# Patient Record
Sex: Male | Born: 1990 | Race: White | Hispanic: No | Marital: Single | Smoking: Current every day smoker
Health system: Southern US, Community
[De-identification: ages and names within clinical notes are randomized; demographics above are authoritative.]

## PROBLEM LIST (undated history)

## (undated) DIAGNOSIS — K219 Gastro-esophageal reflux disease without esophagitis: Secondary | ICD-10-CM

## (undated) DIAGNOSIS — R519 Headache, unspecified: Secondary | ICD-10-CM

## (undated) HISTORY — PX: HERNIA REPAIR: SHX51

## (undated) HISTORY — PX: NO PAST SURGERIES: SHX2092

---

## 2011-04-16 ENCOUNTER — Emergency Department: Payer: Self-pay | Admitting: Emergency Medicine

## 2011-10-19 ENCOUNTER — Emergency Department: Payer: Self-pay | Admitting: Emergency Medicine

## 2011-10-24 ENCOUNTER — Emergency Department: Payer: Self-pay | Admitting: *Deleted

## 2015-08-08 ENCOUNTER — Encounter: Payer: Self-pay | Admitting: Emergency Medicine

## 2015-08-08 ENCOUNTER — Emergency Department: Payer: Self-pay

## 2015-08-08 ENCOUNTER — Other Ambulatory Visit: Payer: Self-pay

## 2015-08-08 ENCOUNTER — Emergency Department
Admission: EM | Admit: 2015-08-08 | Discharge: 2015-08-08 | Disposition: A | Discharge status: Home / Self Care | Payer: Self-pay | Attending: Emergency Medicine | Admitting: Emergency Medicine

## 2015-08-08 DIAGNOSIS — IMO0001 Reserved for inherently not codable concepts without codable children: Secondary | ICD-10-CM

## 2015-08-08 DIAGNOSIS — R030 Elevated blood-pressure reading, without diagnosis of hypertension: Secondary | ICD-10-CM | POA: Insufficient documentation

## 2015-08-08 DIAGNOSIS — Z720 Tobacco use: Secondary | ICD-10-CM | POA: Insufficient documentation

## 2015-08-08 LAB — CBC WITH DIFFERENTIAL/PLATELET
BASOS ABS: 0 10*3/uL (ref 0–0.1)
Basophils Relative: 1 %
EOS ABS: 0.3 10*3/uL (ref 0–0.7)
Eosinophils Relative: 3 %
HCT: 40.5 % (ref 40.0–52.0)
HEMOGLOBIN: 13.5 g/dL (ref 13.0–18.0)
LYMPHS ABS: 3.2 10*3/uL (ref 1.0–3.6)
LYMPHS PCT: 32 %
MCH: 29.9 pg (ref 26.0–34.0)
MCHC: 33.4 g/dL (ref 32.0–36.0)
MCV: 89.6 fL (ref 80.0–100.0)
Monocytes Absolute: 0.5 10*3/uL (ref 0.2–1.0)
Monocytes Relative: 5 %
NEUTROS PCT: 59 %
Neutro Abs: 5.9 10*3/uL (ref 1.4–6.5)
Platelets: 174 10*3/uL (ref 150–440)
RBC: 4.53 MIL/uL (ref 4.40–5.90)
RDW: 13.6 % (ref 11.5–14.5)
WBC: 9.9 10*3/uL (ref 3.8–10.6)

## 2015-08-08 LAB — COMPREHENSIVE METABOLIC PANEL
ALT: 21 U/L (ref 17–63)
AST: 27 U/L (ref 15–41)
Albumin: 4.1 g/dL (ref 3.5–5.0)
Alkaline Phosphatase: 56 U/L (ref 38–126)
Anion gap: 6 (ref 5–15)
BUN: 7 mg/dL (ref 6–20)
CHLORIDE: 105 mmol/L (ref 101–111)
CO2: 27 mmol/L (ref 22–32)
Calcium: 8.8 mg/dL — ABNORMAL LOW (ref 8.9–10.3)
Creatinine, Ser: 0.81 mg/dL (ref 0.61–1.24)
Glucose, Bld: 84 mg/dL (ref 65–99)
POTASSIUM: 4.1 mmol/L (ref 3.5–5.1)
Sodium: 138 mmol/L (ref 135–145)
TOTAL PROTEIN: 6.3 g/dL — AB (ref 6.5–8.1)
Total Bilirubin: 0.2 mg/dL — ABNORMAL LOW (ref 0.3–1.2)

## 2015-08-08 LAB — URINALYSIS COMPLETE WITH MICROSCOPIC (ARMC ONLY)
BILIRUBIN URINE: NEGATIVE
Bacteria, UA: NONE SEEN
Glucose, UA: NEGATIVE mg/dL
HGB URINE DIPSTICK: NEGATIVE
KETONES UR: NEGATIVE mg/dL
LEUKOCYTES UA: NEGATIVE
Nitrite: NEGATIVE
PH: 6 (ref 5.0–8.0)
Protein, ur: NEGATIVE mg/dL
RBC / HPF: NONE SEEN RBC/hpf (ref 0–5)
SPECIFIC GRAVITY, URINE: 1.013 (ref 1.005–1.030)
Squamous Epithelial / LPF: NONE SEEN

## 2015-08-08 LAB — TROPONIN I

## 2015-08-08 NOTE — ED Provider Notes (Signed)
Louis Blackwell, attending physician, personally viewed and interpreted this EKG  EKG Time: 1120 Rate: 71 Rhythm: NSR Axis: normal Intervals: qtc 389 QRS: narrow ST changes: no st elevation    Phineas Semen, MD 08/08/15 1259

## 2015-08-08 NOTE — ED Notes (Signed)
Pt reports going to the plasma clinic today and they "pissed me off" and my pulse went up.  Skin w/d

## 2015-08-08 NOTE — ED Notes (Signed)
States his pulse rate was up at the plasma donation center.  Now is pulse rate is normal but his blood pressure is elevated  Denies any pain  NAD noted

## 2015-08-08 NOTE — Discharge Instructions (Signed)
Hypertension Hypertension is another name for high blood pressure. High blood pressure forces your heart to work harder to pump blood. A blood pressure reading has two numbers, which includes a higher number over a lower number (example: 110/72). HOME CARE   Have your blood pressure rechecked by your doctor.  Only take medicine as told by your doctor. Follow the directions carefully. The medicine does not work as well if you skip doses. Skipping doses also puts you at risk for problems.  Do not smoke.  Monitor your blood pressure at home as told by your doctor. GET HELP IF:  You think you are having a reaction to the medicine you are taking.  You have repeat headaches or feel dizzy.  You have puffiness (swelling) in your ankles.  You have trouble with your vision. GET HELP RIGHT AWAY IF:   You get a very bad headache and are confused.  You feel weak, numb, or faint.  You get chest or belly (abdominal) pain.  You throw up (vomit).  You cannot breathe very well. MAKE SURE YOU:   Understand these instructions.  Will watch your condition.  Will get help right away if you are not doing well or get worse. Document Released: 05/20/2008 Document Revised: 12/07/2013 Document Reviewed: 09/24/2013 [facility] Patient Information 2015 [place], [place]. This information is not intended to replace advice given to you by your health care provider. Make sure you discuss any questions you have with your health care provider.    FOLLOW UP WITH ONE OF THE CLINICS LISTED ON YOUR PAPERS TODAY IF ANY CONTINUED PROBLEMS

## 2015-08-08 NOTE — ED Provider Notes (Signed)
[facility] Emergency Department Provider Note  ____________________________________________  Time seen: Approximately 10:33 AM  I have reviewed the triage vital signs and the nursing notes.   HISTORY  Chief Complaint Anxiety   HPI Louis Blackwell is a 24 y.o. male is here today for evaluation of his pulse rate. He states that 3 times last week including this week he was at the plasma donation center and his pulse rate was in the 100s.   he was told that he could not donate any plasma until he was medically cleared.Patient states it is always this way. He states there is a family history of cardiac disease. He is unaware of any heart problems or hypertension to his knowledge. He does not have a PCP. He continues to smoke.   History reviewed. No pertinent past medical history.  There are no active problems to display for this patient.   History reviewed. No pertinent past surgical history.  No current outpatient prescriptions on file.  Allergies Review of patient's allergies indicates no known allergies.  History reviewed. No pertinent family history.  Social History Social History  Substance Use Topics  . Smoking status: Current Every Day Smoker  . Smokeless tobacco: None  . Alcohol Use: Yes    Review of Systems Constitutional: No fever/chills Eyes: No visual changes. ENT: No sore throat. Cardiovascular: Denies chest pain. Respiratory: Denies shortness of breath. Gastrointestinal: No abdominal pain.  No nausea, no vomiting.  No diarrhea.  No constipation. Genitourinary: Negative for dysuria. Musculoskeletal: Negative for back pain. Skin: Negative for rash. Neurological: Negative for headaches, focal weakness or numbness.  10-point ROS otherwise negative.  ____________________________________________   PHYSICAL EXAM:  VITAL SIGNS: ED Triage Vitals  Enc Vitals Group     BP 08/08/15 0952 162/98 mmHg     Pulse --      Resp 08/08/15  0952 18     Temp 08/08/15 0952 98.1 F (36.7 C)     Temp Source 08/08/15 0952 Oral     SpO2 08/08/15 0952 100 %     Weight 08/08/15 0952 170 lb (77.111 kg)     Height 08/08/15 0952 5\' 8"  (1.727 m)     Head Cir --      Peak Flow --      Pain Score --      Pain Loc --      Pain Edu? --      Excl. in GC? --     Constitutional: Alert and oriented. Well appearing and in no acute distress. Eyes: Conjunctivae are normal. PERRL. EOMI. Head: Atraumatic. Nose: No congestion/rhinnorhea. Neck: No stridor.   Hematological/Lymphatic/Immunilogical: No cervical lymphadenopathy. Cardiovascular: Normal rate, regular rhythm. Grossly normal heart sounds.  Good peripheral circulation. Respiratory: Normal respiratory effort.  No retractions. Lungs CTAB. Gastrointestinal: Soft and nontender. No distention. Musculoskeletal: No lower extremity tenderness nor edema.  No joint effusions. Neurologic:  Normal speech and language. No gross focal neurologic deficits are appreciated. No gait instability. Skin:  Skin is warm, dry and intact. No rash noted. Psychiatric: Mood and affect are normal. Speech and behavior are normal.  ____________________________________________   LABS (all labs ordered are listed, but only abnormal results are displayed)  Labs Reviewed  COMPREHENSIVE METABOLIC PANEL - Abnormal; Notable for the following:    Calcium 8.8 (*)    Total Protein 6.3 (*)    Total Bilirubin 0.2 (*)    All other components within normal limits  URINALYSIS COMPLETEWITH MICROSCOPIC ([facility] ONLY) - Abnormal; Notable  for the following:    Color, Urine STRAW (*)    APPearance CLEAR (*)    All other components within normal limits  TROPONIN I  CBC WITH DIFFERENTIAL/PLATELET   ____________________________________________  EKG  EKG per Dr. Derrill Kay ____________________________________________  RADIOLOGY  Chest x-ray per radiologist  negative ____________________________________________   PROCEDURES  Procedure(s) performed: None  Critical Care performed: No  ____________________________________________   INITIAL IMPRESSION / ASSESSMENT AND PLAN / ED COURSE  Pertinent labs & imaging results that were available during my care of the patient were reviewed by me and considered in my medical decision making (see chart for details).  Patient was given a note to take to the plasma clinic stating that today there was no findings that are preventing him from giving plasma. He is to follow-up on his elevated blood pressure with one of the free clinics that was listed on his paper. ____________________________________________   FINAL CLINICAL IMPRESSION(S) / ED DIAGNOSES  Final diagnoses:  Blood pressure elevated      Tommi Rumps, PA-C 08/08/15 1516  Emily Filbert, MD 08/08/15 1524

## 2017-01-28 ENCOUNTER — Encounter: Payer: Self-pay | Admitting: Emergency Medicine

## 2017-01-28 DIAGNOSIS — K0889 Other specified disorders of teeth and supporting structures: Secondary | ICD-10-CM | POA: Insufficient documentation

## 2017-01-28 DIAGNOSIS — F17200 Nicotine dependence, unspecified, uncomplicated: Secondary | ICD-10-CM | POA: Insufficient documentation

## 2017-01-28 DIAGNOSIS — Z5321 Procedure and treatment not carried out due to patient leaving prior to being seen by health care provider: Secondary | ICD-10-CM | POA: Insufficient documentation

## 2017-01-28 NOTE — ED Triage Notes (Signed)
Patient ambulatory to triage with steady gait, without difficulty or distress noted; pt reports left upper dental pain since this am

## 2017-01-29 ENCOUNTER — Emergency Department
Admission: EM | Admit: 2017-01-29 | Discharge: 2017-01-29 | Disposition: A | Discharge status: Home / Self Care | Payer: Self-pay | Attending: Emergency Medicine | Admitting: Emergency Medicine

## 2017-03-24 ENCOUNTER — Encounter: Payer: Self-pay | Admitting: Emergency Medicine

## 2017-03-24 ENCOUNTER — Emergency Department
Admission: EM | Admit: 2017-03-24 | Discharge: 2017-03-24 | Disposition: A | Discharge status: Home / Self Care | Payer: Self-pay | Attending: Emergency Medicine | Admitting: Emergency Medicine

## 2017-03-24 DIAGNOSIS — Y929 Unspecified place or not applicable: Secondary | ICD-10-CM | POA: Insufficient documentation

## 2017-03-24 DIAGNOSIS — F17210 Nicotine dependence, cigarettes, uncomplicated: Secondary | ICD-10-CM | POA: Insufficient documentation

## 2017-03-24 DIAGNOSIS — Y939 Activity, unspecified: Secondary | ICD-10-CM | POA: Insufficient documentation

## 2017-03-24 DIAGNOSIS — Y999 Unspecified external cause status: Secondary | ICD-10-CM | POA: Insufficient documentation

## 2017-03-24 DIAGNOSIS — T161XXA Foreign body in right ear, initial encounter: Secondary | ICD-10-CM | POA: Insufficient documentation

## 2017-03-24 DIAGNOSIS — X58XXXA Exposure to other specified factors, initial encounter: Secondary | ICD-10-CM | POA: Insufficient documentation

## 2017-03-24 MED ORDER — OXYCODONE-ACETAMINOPHEN 5-325 MG PO TABS
1.0000 | ORAL_TABLET | Freq: Once | ORAL | Status: AC
  Administered 2017-03-24: 1 via ORAL
  Filled 2017-03-24: qty 1

## 2017-03-24 MED ORDER — LIDOCAINE HCL (PF) 1 % IJ SOLN
INTRAMUSCULAR | Status: AC
  Filled 2017-03-24: qty 5

## 2017-03-24 MED ORDER — IBUPROFEN 600 MG PO TABS
600.0000 mg | ORAL_TABLET | Freq: Once | ORAL | Status: AC
  Administered 2017-03-24: 600 mg via ORAL
  Filled 2017-03-24: qty 1

## 2017-03-24 NOTE — ED Notes (Signed)
See triage note   States he woke up with pain to right ear feels like something is in his ear

## 2017-03-24 NOTE — ED Provider Notes (Signed)
[facility] Emergency Department Provider Note   ____________________________________________   First MD Initiated Contact with Patient 03/24/17 [phone]     (approximate)  I have reviewed the triage vital signs and the nursing notes.   HISTORY  Chief Complaint Otalgia    HPI Louis Blackwell is a 26 y.o. male patient complain of pain with suspected foreign body in the right ear. Patient states sensation point a.m. awakening. No palliative measures for his complaint. Patient rates his pain as a 10 over 10. Patient denies hearing loss.   History reviewed. No pertinent past medical history.  There are no active problems to display for this patient.   History reviewed. No pertinent surgical history.  Prior to Admission medications   Not on File    Allergies Patient has no known allergies.  History reviewed. No pertinent family history.  Social History Social History  Substance Use Topics  . Smoking status: Current Every Day Smoker    Packs/day: 0.50    Types: Cigarettes  . Smokeless tobacco: Never Used  . Alcohol use Yes    Review of Systems Constitutional: No fever/chills Eyes: No visual changes. ENT: No sore throat.Right ear pain Cardiovascular: Denies chest pain. Respiratory: Denies shortness of breath. Gastrointestinal: No abdominal pain.  No nausea, no vomiting.  No diarrhea.  No constipation. Genitourinary: Negative for dysuria. Musculoskeletal: Negative for back pain. Skin: Negative for rash. Neurological: Negative for headaches, focal weakness or numbness.    ____________________________________________   PHYSICAL EXAM:  VITAL SIGNS: ED Triage Vitals  Enc Vitals Group     BP 03/24/17 0748 (!) 148/90     Pulse Rate 03/24/17 0748 86     Resp 03/24/17 0748 16     Temp 03/24/17 0748 98.3 F (36.8 C)     Temp Source 03/24/17 0748 Oral     SpO2 03/24/17 0748 95 %     Weight 03/24/17 0749 202 lb (91.6 kg)     Height  03/24/17 0749  (1.727 m)     Head Circumference --      Peak Flow --      Pain Score 03/24/17 0747 10     Pain Loc --      Pain Edu? --      Excl. in GC? --     Constitutional: Alert and oriented. Well appearing and in no acute distress. Eyes: Conjunctivae are normal. PERRL. EOMI. Head: Atraumatic. Nose: No congestion/rhinnorhea. EARS: Insect in right ear canal Mouth/Throat: Mucous membranes are moist.  Oropharynx non-erythematous. Neck: No stridor.  No cervical spine tenderness to palpation. Hematological/Lymphatic/Immunilogical: No cervical lymphadenopathy. ardiovascular: Normal rate, regular rhythm. Grossly normal heart sounds.  Good peripheral circulation. Respiratory: Normal respiratory effort.  No retractions. Lungs CTAB. Gastrointestinal: Soft and nontender. No distention. No abdominal bruits. No CVA tenderness. Musculoskeletal: No lower extremity tenderness nor edema.  No joint effusions. Neurologic:  Normal speech and language. No gross focal neurologic deficits are appreciated. No gait instability. Skin:  Skin is warm, dry and intact. No rash noted. Psychiatric: Mood and affect are normal. Speech and behavior are normal.  ____________________________________________   LABS (all labs ordered are listed, but only abnormal results are displayed)  Labs Reviewed - No data to display ____________________________________________  EKG   ____________________________________________  RADIOLOGY   ____________________________________________   PROCEDURES  Procedure(s) performed: None  Procedures  Critical Care performed: No  ____________________________________________   INITIAL IMPRESSION / ASSESSMENT AND PLAN / ED COURSE  Pertinent labs & imaging results  that were available during my care of the patient were reviewed by me and considered in my medical decision making (see chart for details).  Foreign body consistent insect in right ear. Lidocaine was  placed in the ear. Insect was removed intact from right ear canal with alligator forceps.      ____________________________________________   FINAL CLINICAL IMPRESSION(S) / ED DIAGNOSES  Final diagnoses:  Foreign body of right ear, initial encounter  Patient given discharge care instructions. Patient [facility].    NEW MEDICATIONS STARTED DURING THIS VISIT:  New Prescriptions   No medications on file     Note:  This document was prepared using Dragon voice recognition software and may include unintentional dictation errors.    Joni Reining, PA-C 03/24/17 1610    Jene Every, MD 03/25/17 [phone]

## 2017-03-24 NOTE — ED Triage Notes (Signed)
Awoke this AM with right ear pain. States it feels like there is something in his ear.  Denies recent URI.

## 2017-11-12 ENCOUNTER — Encounter: Payer: Self-pay | Admitting: *Deleted

## 2017-11-12 ENCOUNTER — Other Ambulatory Visit: Payer: Self-pay

## 2017-11-12 DIAGNOSIS — F17210 Nicotine dependence, cigarettes, uncomplicated: Secondary | ICD-10-CM | POA: Diagnosis not present

## 2017-11-12 DIAGNOSIS — R42 Dizziness and giddiness: Secondary | ICD-10-CM | POA: Diagnosis not present

## 2017-11-12 DIAGNOSIS — R1013 Epigastric pain: Secondary | ICD-10-CM | POA: Diagnosis not present

## 2017-11-12 DIAGNOSIS — R109 Unspecified abdominal pain: Secondary | ICD-10-CM | POA: Diagnosis present

## 2017-11-12 LAB — URINALYSIS, COMPLETE (UACMP) WITH MICROSCOPIC
Bacteria, UA: NONE SEEN
Bilirubin Urine: NEGATIVE
Glucose, UA: NEGATIVE mg/dL
HGB URINE DIPSTICK: NEGATIVE
Ketones, ur: NEGATIVE mg/dL
Leukocytes, UA: NEGATIVE
Nitrite: NEGATIVE
PROTEIN: NEGATIVE mg/dL
SPECIFIC GRAVITY, URINE: 1.019 (ref 1.005–1.030)
pH: 5 (ref 5.0–8.0)

## 2017-11-12 LAB — COMPREHENSIVE METABOLIC PANEL
ALBUMIN: 4.3 g/dL (ref 3.5–5.0)
ALK PHOS: 71 U/L (ref 38–126)
ALT: 33 U/L (ref 17–63)
AST: 33 U/L (ref 15–41)
Anion gap: 9 (ref 5–15)
BILIRUBIN TOTAL: 0.7 mg/dL (ref 0.3–1.2)
BUN: 11 mg/dL (ref 6–20)
CALCIUM: 9.3 mg/dL (ref 8.9–10.3)
CHLORIDE: 102 mmol/L (ref 101–111)
CO2: 26 mmol/L (ref 22–32)
CREATININE: 0.73 mg/dL (ref 0.61–1.24)
GFR calc non Af Amer: >60 mL/min (ref >60)
GLUCOSE: 96 mg/dL (ref 65–99)
Potassium: 3.6 mmol/L (ref 3.5–5.1)
SODIUM: 137 mmol/L (ref 135–145)
Total Protein: 6.9 g/dL (ref 6.5–8.1)

## 2017-11-12 LAB — CBC
HCT: 43.2 % (ref 40.0–52.0)
HEMOGLOBIN: 14.8 g/dL (ref 13.0–18.0)
MCH: 30.4 pg (ref 26.0–34.0)
MCHC: 34.4 g/dL (ref 32.0–36.0)
MCV: 88.4 fL (ref 80.0–100.0)
Platelets: 175 10*3/uL (ref 150–440)
RBC: 4.88 MIL/uL (ref 4.40–5.90)
RDW: 13.4 % (ref 11.5–14.5)
WBC: 9.2 10*3/uL (ref 3.8–10.6)

## 2017-11-12 LAB — LIPASE, BLOOD: Lipase: 33 U/L (ref 11–51)

## 2017-11-12 NOTE — ED Triage Notes (Signed)
Pt ambulatory to triage.  Pt reports dizziness since last night.  Sx began at work.  Pt states he also has abd pain for 3 days.  No n/v/d.  Pt alert.  Speech clear.

## 2017-11-13 ENCOUNTER — Emergency Department
Admission: EM | Admit: 2017-11-13 | Discharge: 2017-11-13 | Disposition: A | Discharge status: Home / Self Care | Payer: PRIVATE HEALTH INSURANCE | Attending: Emergency Medicine | Admitting: Emergency Medicine

## 2017-11-13 DIAGNOSIS — R42 Dizziness and giddiness: Secondary | ICD-10-CM

## 2017-11-13 DIAGNOSIS — R1013 Epigastric pain: Secondary | ICD-10-CM

## 2017-11-13 MED ORDER — SUCRALFATE 1 G PO TABS: 1.0000 g | ORAL_TABLET | Freq: Four times a day (QID) | ORAL | 1 refills | Status: DC | PRN

## 2017-11-13 MED ORDER — ONDANSETRON HCL 4 MG PO TABS: ORAL_TABLET | ORAL | 0 refills | Status: DC

## 2017-11-13 MED ORDER — ONDANSETRON 4 MG PO TBDP
4.0000 mg | ORAL_TABLET | Freq: Once | ORAL | Status: AC
  Administered 2017-11-13: 4 mg via ORAL
  Filled 2017-11-13: qty 1

## 2017-11-13 NOTE — Discharge Instructions (Signed)
Your workup in the Emergency Department today was reassuring.  We did not find any specific abnormalities.  We recommend you drink plenty of fluids, take your regular medications and/or any new ones prescribed today, and follow up with the doctor(s) listed in these documents as recommended.  Return to the Emergency Department if you develop new or worsening symptoms that concern you.  

## 2017-11-13 NOTE — ED Provider Notes (Signed)
[facility] Emergency Department Provider Note  ____________________________________________   First MD Initiated Contact with Patient 11/13/17 0126     (approximate)  I have reviewed the triage vital signs and the nursing notes.   HISTORY  Chief Complaint Dizziness and Abdominal Pain    HPI Louis Blackwell is a 26 y.o. male with no reported past medical history who presents for several days of gradual onset but persistent complaints including lightheadedness, epigastic pain, nausea, and general malaise.  Nothing particular makes his symptoms better or worse but he states that the symptoms are severe.  He denies fever/chills, chest pain, shortness of breath, vomiting, diarrhea, and dysuria.  He has had no numbness or tingling.  He describes the "dizziness" as lightheadedness rather than the sensation of anything spinning.  He has had no difficulty with ambulation.  He says he just feels tired and "not good".  He does report that he drinks alcohol regularly although "less than I used to" but he did have some alcohol the night before his abdominal pain and other symptoms again.  He denies drug use "for at least 30 days".  He is a regular smoker.  No past medical history on file.  There are no active problems to display for this patient.   No past surgical history on file.  Prior to Admission medications   Medication Sig Start Date End Date Taking? Authorizing Provider  ondansetron (ZOFRAN) 4 MG tablet Take 1-2 tabs by mouth every 8 hours as needed for nausea/vomiting 11/13/17   Loleta RoseForbach, Chelsey Kimberley, MD  sucralfate (CARAFATE) 1 g tablet Take 1 tablet (1 g total) by mouth 4 (four) times daily as needed (for abdominal discomfort, nausea, and/or vomiting). 11/13/17   Loleta RoseForbach, Verlon Pischke, MD    Allergies Patient has no known allergies.  No family history on file.  Social History Social History   Tobacco Use  . Smoking status: Current Every Day Smoker    Packs/day: 0.50     Types: Cigarettes  . Smokeless tobacco: Never Used  Substance Use Topics  . Alcohol use: Yes  . Drug use: Yes    Types: Marijuana    Comment: "once in awhile"    Review of Systems Constitutional: No fever/chills.  General malaise Eyes: No visual changes. ENT: No sore throat. Cardiovascular: Denies chest pain. Respiratory: Denies shortness of breath. Gastrointestinal: No abdominal pain.  Nausea, no vomiting.   Lack of appetite. Genitourinary: Negative for dysuria. Musculoskeletal: Negative for neck pain.  Negative for back pain. Integumentary: Negative for rash. Neurological: Negative for headaches, focal weakness or numbness.  Lightheadedness.   ____________________________________________   PHYSICAL EXAM:  VITAL SIGNS: ED Triage Vitals  Enc Vitals Group     BP 11/12/17 2319 (!) 150/80     Pulse Rate 11/12/17 2319 85     Resp 11/12/17 2319 20     Temp 11/12/17 2319 97.8 F (36.6 C)     Temp Source 11/12/17 2319 Oral     SpO2 11/12/17 2319 98 %     Weight 11/12/17 2316 79.4 kg (175 lb)     Height 11/12/17 2316 1.727 m (5\' 8" )     Head Circumference --      Peak Flow --      Pain Score 11/12/17 2316 7     Pain Loc --      Pain Edu? --      Excl. in GC? --     Constitutional: Alert and oriented. Well appearing and in no  acute distress. Eyes: Conjunctivae are normal.  Head: Atraumatic. Nose: No congestion/rhinnorhea. Mouth/Throat: Poor dentition Neck: No stridor.  No meningeal signs.   Cardiovascular: Normal rate, regular rhythm. Good peripheral circulation. Grossly normal heart sounds. Respiratory: Normal respiratory effort.  No retractions. Lungs CTAB. Gastrointestinal: Soft and nontender. No distention.  Musculoskeletal: No lower extremity tenderness nor edema. No gross deformities of extremities. Neurologic:  Normal speech and language. No gross focal neurologic deficits are appreciated.  Skin:  Skin is warm, dry and intact. No rash noted. Psychiatric:  Mood and affect are normal. Speech and behavior are normal.  ____________________________________________   LABS (all labs ordered are listed, but only abnormal results are displayed)  Labs Reviewed  URINALYSIS, COMPLETE (UACMP) WITH MICROSCOPIC - Abnormal; Notable for the following components:      Result Value   Color, Urine YELLOW (*)    APPearance CLEAR (*)    Squamous Epithelial / LPF 0-5 (*)    All other components within normal limits  LIPASE, BLOOD  COMPREHENSIVE METABOLIC PANEL  CBC   ____________________________________________  EKG  ED ECG REPORT I, Loleta Roseory Nova Evett, the attending physician, personally viewed and interpreted this ECG.  Date: 11/12/2017 EKG Time: 23: 17 Rate: 75 Rhythm: normal sinus rhythm QRS Axis: normal Intervals: normal ST/T Wave abnormalities: normal Narrative Interpretation: no evidence of acute ischemia  ____________________________________________  RADIOLOGY   No results found.  ____________________________________________   PROCEDURES  Critical Care performed: No   Procedure(s) performed:   Procedures   ____________________________________________   INITIAL IMPRESSION / ASSESSMENT AND PLAN / ED COURSE  As part of my medical decision making, I reviewed the following data within the electronic MEDICAL RECORD NUMBER Nursing notes reviewed and incorporated, Labs reviewed  and EKG interpreted     Differential diagnosis includes, but is not limited to, biliary disease (biliary colic, acute cholecystitis, cholangitis, choledocholithiasis, etc), intrathoracic causes for epigastric abdominal pain including ACS, gastritis, duodenitis, pancreatitis, small bowel or large bowel obstruction, abdominal aortic aneurysm, hernia, and gastritis.  However, this patient is well-appearing and in no acute distress with completely normal lab work and a normal EKG.  He has no abdominal tenderness to palpation which makes biliary colic much less  likely.  We discussed the possibility of a mild gastritis (although he has not been vomiting) in the setting of vague epigastric pain after drinking alcohol.  His lipase is normal and his hepatic function panel is normal as well making pancreatitis unlikely particularly in the setting of no abdominal tenderness.  I counseled the patient to drink plenty of clear fluids and that he may have a viral syndrome that is making him feel bad in general.  I gave him a prescription for sucralfate assuming possible gastritis and Zofran and provided by usual and customary return precautions.  He understands and agrees with the plan.     ____________________________________________  FINAL CLINICAL IMPRESSION(S) / ED DIAGNOSES  Final diagnoses:  Epigastric pain  Lightheadedness     MEDICATIONS GIVEN DURING THIS VISIT:  Medications  ondansetron (ZOFRAN-ODT) disintegrating tablet 4 mg (4 mg Oral Given 11/13/17 0208)     ED Discharge Orders        Ordered    sucralfate (CARAFATE) 1 g tablet  4 times daily PRN     11/13/17 0150    ondansetron (ZOFRAN) 4 MG tablet     11/13/17 0150       Note:  This document was prepared using Dragon voice recognition software and may include unintentional dictation errors.  Loleta Rose, MD 11/13/17 [phone]

## 2018-04-27 ENCOUNTER — Emergency Department
Admission: EM | Admit: 2018-04-27 | Discharge: 2018-04-27 | Disposition: A | Discharge status: Home / Self Care | Payer: PRIVATE HEALTH INSURANCE | Attending: Emergency Medicine | Admitting: Emergency Medicine

## 2018-04-27 ENCOUNTER — Encounter: Payer: Self-pay | Admitting: Emergency Medicine

## 2018-04-27 ENCOUNTER — Emergency Department: Payer: PRIVATE HEALTH INSURANCE

## 2018-04-27 ENCOUNTER — Other Ambulatory Visit: Payer: Self-pay

## 2018-04-27 DIAGNOSIS — F17210 Nicotine dependence, cigarettes, uncomplicated: Secondary | ICD-10-CM | POA: Diagnosis not present

## 2018-04-27 DIAGNOSIS — Y92410 Unspecified street and highway as the place of occurrence of the external cause: Secondary | ICD-10-CM | POA: Insufficient documentation

## 2018-04-27 DIAGNOSIS — M546 Pain in thoracic spine: Secondary | ICD-10-CM | POA: Diagnosis not present

## 2018-04-27 DIAGNOSIS — Y999 Unspecified external cause status: Secondary | ICD-10-CM | POA: Diagnosis not present

## 2018-04-27 DIAGNOSIS — M542 Cervicalgia: Secondary | ICD-10-CM | POA: Diagnosis not present

## 2018-04-27 DIAGNOSIS — Y939 Activity, unspecified: Secondary | ICD-10-CM | POA: Diagnosis not present

## 2018-04-27 MED ORDER — CYCLOBENZAPRINE HCL 10 MG PO TABS: 10.0000 mg | ORAL_TABLET | Freq: Three times a day (TID) | ORAL | 0 refills | Status: AC | PRN

## 2018-04-27 MED ORDER — MELOXICAM 15 MG PO TABS: 15.0000 mg | ORAL_TABLET | Freq: Every day | ORAL | 1 refills | Status: AC
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
## 2018-04-27 NOTE — ED Notes (Signed)
Pt ambulatory to POV without difficulty. VSS. NAD. Discharge instructions, RX and follow up discussed. All questions addressed.  

## 2019-07-22 ENCOUNTER — Encounter: Payer: Self-pay | Admitting: Emergency Medicine

## 2019-07-22 ENCOUNTER — Other Ambulatory Visit: Payer: Self-pay

## 2019-07-22 ENCOUNTER — Emergency Department
Admission: EM | Admit: 2019-07-22 | Discharge: 2019-07-22 | Disposition: A | Discharge status: Home / Self Care | Payer: HRSA Program | Attending: Emergency Medicine | Admitting: Emergency Medicine

## 2019-07-22 ENCOUNTER — Emergency Department: Payer: HRSA Program

## 2019-07-22 DIAGNOSIS — F17210 Nicotine dependence, cigarettes, uncomplicated: Secondary | ICD-10-CM | POA: Diagnosis not present

## 2019-07-22 DIAGNOSIS — J069 Acute upper respiratory infection, unspecified: Secondary | ICD-10-CM | POA: Diagnosis not present

## 2019-07-22 DIAGNOSIS — R05 Cough: Secondary | ICD-10-CM | POA: Diagnosis present

## 2019-07-22 DIAGNOSIS — Z20828 Contact with and (suspected) exposure to other viral communicable diseases: Secondary | ICD-10-CM | POA: Diagnosis not present

## 2019-07-22 DIAGNOSIS — Z20822 Contact with and (suspected) exposure to covid-19: Secondary | ICD-10-CM

## 2019-07-22 DIAGNOSIS — F1210 Cannabis abuse, uncomplicated: Secondary | ICD-10-CM | POA: Diagnosis not present

## 2019-07-22 DIAGNOSIS — R070 Pain in throat: Secondary | ICD-10-CM | POA: Insufficient documentation

## 2019-07-22 DIAGNOSIS — R079 Chest pain, unspecified: Secondary | ICD-10-CM | POA: Diagnosis not present

## 2019-07-22 LAB — SARS CORONAVIRUS 2 (TAT 6-24 HRS): SARS Coronavirus 2: NEGATIVE

## 2019-07-22 NOTE — ED Triage Notes (Signed)
Pt c/o cough xfew days with new onset of sore throat. Denies any fever, SOB. VSS NAD noted

## 2019-07-22 NOTE — ED Provider Notes (Signed)
[facility] Emergency Department Provider Note  ____________________________________________   First MD Initiated Contact with Patient 07/22/19 1717     (approximate)  I have reviewed the triage vital signs and the nursing notes.   HISTORY  Chief Complaint Cough    HPI Louis Blackwell is a 28 y.o. male presents emergency department complaining of cough for few days with new onset of sore throat.  Denies any fever or chills.  However he does work and is unsure if any coworkers have covid or have been exposed COVID.  He states his chest does hurt a little bit when he coughs.  His mom is concerned that he has covid.    History reviewed. No pertinent past medical history.  There are no active problems to display for this patient.   History reviewed. No pertinent surgical history.  Prior to Admission medications   Medication Sig Start Date End Date Taking? Authorizing Provider  sucralfate (CARAFATE) 1 g tablet Take 1 tablet (1 g total) by mouth 4 (four) times daily as needed (for abdominal discomfort, nausea, and/or vomiting). Patient not taking: Reported on 07/22/2019 11/13/17 07/22/19  Loleta RoseForbach, Cory, MD    Allergies Patient has no known allergies.  No family history on file.  Social History Social History   Tobacco Use  . Smoking status: Current Every Day Smoker    Packs/day: 0.50    Types: Cigarettes  . Smokeless tobacco: Never Used  Substance Use Topics  . Alcohol use: Yes  . Drug use: Yes    Types: Marijuana    Comment: "once in awhile"    Review of Systems  Constitutional: No fever/chills Eyes: No visual changes. ENT: Positive sore throat. Respiratory: Positive cough Genitourinary: Negative for dysuria. Musculoskeletal: Negative for back pain. Skin: Negative for rash.    ____________________________________________   PHYSICAL EXAM:  VITAL SIGNS: ED Triage Vitals [07/22/19 1628]  Enc Vitals Group     BP (!) 140/92   Pulse Rate 93     Resp 16     Temp 98 F (36.7 C)     Temp Source Oral     SpO2 97 %     Weight 170 lb (77.1 kg)     Height      Head Circumference      Peak Flow      Pain Score 10     Pain Loc      Pain Edu?      Excl. in GC?     Constitutional: Alert and oriented. Well appearing and in no acute distress. Eyes: Conjunctivae are normal.  Head: Atraumatic. Nose: No congestion/rhinnorhea. Mouth/Throat: Mucous membranes are moist.  Throat appears normal Neck:  supple no lymphadenopathy noted Cardiovascular: Normal rate, regular rhythm. Heart sounds are normal Respiratory: Normal respiratory effort.  No retractions, lungs c t a  GU: deferred Musculoskeletal: FROM all extremities, warm and well perfused Neurologic:  Normal speech and language.  Skin:  Skin is warm, dry and intact. No rash noted. Psychiatric: Mood and affect are normal. Speech and behavior are normal.  ____________________________________________   LABS (all labs ordered are listed, but only abnormal results are displayed)  Labs Reviewed  SARS CORONAVIRUS 2   ____________________________________________   ____________________________________________  RADIOLOGY  Chest x-ray is negative  ____________________________________________   PROCEDURES  Procedure(s) performed: No  Procedures    ____________________________________________   INITIAL IMPRESSION / ASSESSMENT AND PLAN / ED COURSE  Pertinent labs & imaging results that were available during my care  of the patient were reviewed by me and considered in my medical decision making (see chart for details).   Patient is a 28 year old male presents emergency department with complaints of a cough with some sore throat.  Symptoms for several days.  Unknown exposures to COVID.  Physical exam shows patient is nontoxic.  Cough is dry.  Throat appears to be normal.  Patient is afebrile while here in the ED and his oxygen level is 97% on room air.   Chest x-ray COVID-19 test    ----------------------------------------- 6:32 PM on 07/22/2019 -----------------------------------------  Chest x-ray is normal  Explained findings to the patient.  He was given a note stating his COVID-19 test is pending and he should remain out of work until the test is resulted.  If it is positive he should remain out of work an additional 10 days.  He is taking over-the-counter cold medication.  Return to the emergency department worsening.  He states he understands will comply.  Is discharged stable condition. Louis Blackwell was evaluated in Emergency Department on 07/22/2019 for the symptoms described in the history of present illness. He was evaluated in the context of the global COVID-19 pandemic, which necessitated consideration that the patient might be at risk for infection with the SARS-CoV-2 virus that causes COVID-19. Institutional protocols and algorithms that pertain to the evaluation of patients at risk for COVID-19 are in a state of rapid change based on information released by regulatory bodies including the CDC and federal and state organizations. These policies and algorithms were followed during the patient's care in the ED.   As part of my medical decision making, I reviewed the following data within the [place] notes reviewed and incorporated, Old chart reviewed, Radiograph reviewed chest x-ray is normal, Notes from prior ED visits and [place] Controlled Substance Database  ____________________________________________   FINAL CLINICAL IMPRESSION(S) / ED DIAGNOSES  Final diagnoses:  Suspected Covid-19 Virus Infection  Acute upper respiratory infection      NEW MEDICATIONS STARTED DURING THIS VISIT:  Current Discharge Medication List       Note:  This document was prepared using Dragon voice recognition software and may include unintentional dictation errors.    Versie Starks, PA-C 07/22/19 Paulino Door, MD 07/22/19 2024

## 2019-07-22 NOTE — Discharge Instructions (Signed)
Follow-up with your regular doctor or the acute care if not improving in 3 days.  Your COVID-19 test should be back in 24 hours.  You should remain out of work and quarantined until you receive test results.  If your test results are positive you should remain out of work for 10 days.  Return to emergency department if you are worsening.

## 2019-07-25 ENCOUNTER — Other Ambulatory Visit: Payer: Self-pay

## 2019-07-25 ENCOUNTER — Emergency Department: Payer: Self-pay

## 2019-07-25 ENCOUNTER — Emergency Department
Admission: EM | Admit: 2019-07-25 | Discharge: 2019-07-25 | Disposition: A | Discharge status: Home / Self Care | Payer: Self-pay | Attending: Emergency Medicine | Admitting: Emergency Medicine

## 2019-07-25 ENCOUNTER — Encounter: Payer: Self-pay | Admitting: Emergency Medicine

## 2019-07-25 DIAGNOSIS — Y939 Activity, unspecified: Secondary | ICD-10-CM | POA: Insufficient documentation

## 2019-07-25 DIAGNOSIS — W19XXXA Unspecified fall, initial encounter: Secondary | ICD-10-CM

## 2019-07-25 DIAGNOSIS — Y92008 Other place in unspecified non-institutional (private) residence as the place of occurrence of the external cause: Secondary | ICD-10-CM | POA: Insufficient documentation

## 2019-07-25 DIAGNOSIS — Y999 Unspecified external cause status: Secondary | ICD-10-CM | POA: Insufficient documentation

## 2019-07-25 DIAGNOSIS — F17210 Nicotine dependence, cigarettes, uncomplicated: Secondary | ICD-10-CM | POA: Insufficient documentation

## 2019-07-25 DIAGNOSIS — S3992XA Unspecified injury of lower back, initial encounter: Secondary | ICD-10-CM | POA: Insufficient documentation

## 2019-07-25 DIAGNOSIS — W010XXA Fall on same level from slipping, tripping and stumbling without subsequent striking against object, initial encounter: Secondary | ICD-10-CM | POA: Insufficient documentation

## 2019-07-25 DIAGNOSIS — Y92009 Unspecified place in unspecified non-institutional (private) residence as the place of occurrence of the external cause: Secondary | ICD-10-CM

## 2019-07-25 MED ORDER — HYDROCODONE-ACETAMINOPHEN 5-325 MG PO TABS: 1.0000 | ORAL_TABLET | Freq: Three times a day (TID) | ORAL | 0 refills | Status: AC | PRN

## 2019-07-25 MED ORDER — HYDROCODONE-ACETAMINOPHEN 5-325 MG PO TABS
1.0000 | ORAL_TABLET | Freq: Once | ORAL | Status: AC
  Administered 2019-07-25: 15:00:00 1 via ORAL
  Filled 2019-07-25: qty 1

## 2019-07-25 MED ORDER — NAPROXEN 500 MG PO TABS: 500.0000 mg | ORAL_TABLET | Freq: Two times a day (BID) | ORAL | 0 refills | Status: AC

## 2019-07-25 MED ORDER — CYCLOBENZAPRINE HCL 5 MG PO TABS: 5.0000 mg | ORAL_TABLET | Freq: Three times a day (TID) | ORAL | 0 refills | Status: AC | PRN

## 2019-07-25 NOTE — ED Triage Notes (Signed)
Pt to ED via POV for fall 6 days ago. Pt states that he slipped and fell and is still having tailbone pain. Pt is in NAD.

## 2019-07-25 NOTE — ED Provider Notes (Signed)
[facility] Emergency Department Provider Note ____________________________________________  Time seen: 1319  I have reviewed the triage vital signs and the nursing notes.  HISTORY  Chief Complaint  Fall  HPI Aquilla HackerDonald Cassar is a 28 y.o. male presents to the ED for evaluation of pain at the tailbone and coccyx.  Patient describes a mechanical fall at home about 6 days prior.  According to the patient he slipped on a wet porch, and landed on his buttocks.  Since that time is had increasing pain to the buttocks at the gluteal cleft.  He denies any chest pain, shortness of breath or other injury at this time.  He presents to the ED for concern over possible tailbone fracture.  History reviewed. No pertinent past medical history.  There are no active problems to display for this patient.  History reviewed. No pertinent surgical history.  Prior to Admission medications   Medication Sig Start Date End Date Taking? Authorizing Provider  cyclobenzaprine (FLEXERIL) 5 MG tablet Take 1 tablet (5 mg total) by mouth 3 (three) times daily as needed for up to 3 days. 07/25/19 07/28/19  Zhavia Cunanan, Charlesetta IvoryJenise V Bacon, PA-C  HYDROcodone-acetaminophen (NORCO) 5-325 MG tablet Take 1 tablet by mouth 3 (three) times daily as needed for up to 2 days. 07/25/19 07/27/19  Shatira Dobosz, Charlesetta IvoryJenise V Bacon, PA-C  naproxen (NAPROSYN) 500 MG tablet Take 1 tablet (500 mg total) by mouth 2 (two) times daily with a meal for 15 days. 07/25/19 08/09/19  Chelly Dombeck, Charlesetta IvoryJenise V Bacon, PA-C  sucralfate (CARAFATE) 1 g tablet Take 1 tablet (1 g total) by mouth 4 (four) times daily as needed (for abdominal discomfort, nausea, and/or vomiting). Patient not taking: Reported on 07/22/2019 11/13/17 07/22/19  Loleta RoseForbach, Cory, MD   Allergies Patient has no known allergies.  No family history on file.  Social History Social History   Tobacco Use  . Smoking status: Current Every Day Smoker    Packs/day: 0.50    Types: Cigarettes  .  Smokeless tobacco: Never Used  Substance Use Topics  . Alcohol use: Yes  . Drug use: Yes    Types: Marijuana    Comment: "once in awhile"   Review of Systems  Constitutional: Negative for fever. Eyes: Negative for visual changes. ENT: Negative for sore throat. Cardiovascular: Negative for chest pain. Respiratory: Negative for shortness of breath. Gastrointestinal: Negative for abdominal pain, vomiting and diarrhea. Genitourinary: Negative for dysuria. Musculoskeletal: Negative for back pain.  Coccyx pain as above. Skin: Negative for rash. Neurological: Negative for headaches, focal weakness or numbness. ____________________________________________  PHYSICAL EXAM:  VITAL SIGNS: ED Triage Vitals  Enc Vitals Group     BP 07/25/19 1244 (!) 157/97     Pulse Rate 07/25/19 1244 98     Resp 07/25/19 1244 16     Temp 07/25/19 1244 98.6 F (37 C)     Temp Source 07/25/19 1244 Oral     SpO2 07/25/19 1244 97 %     Weight 07/25/19 1243 186 lb (84.4 kg)     Height --      Head Circumference --      Peak Flow --      Pain Score 07/25/19 1243 10     Pain Loc --      Pain Edu? --      Excl. in GC? --     Constitutional: Alert and oriented. Well appearing and in no distress. Head: Normocephalic and atraumatic. Eyes: Conjunctivae are normal. Normal extraocular movements Cardiovascular:  Normal rate, regular rhythm. Normal distal pulses. Respiratory: Normal respiratory effort. No wheezes/rales/rhonchi. Gastrointestinal: Soft and nontender. No distention. Musculoskeletal: Normal spinal alignment without midline tenderness, spasm, deformity, or step-off.  Patient transitions from sit to stand without assistance.  He is with some mild tenderness to palpation to the top of the gluteal cleft in the midline.  He is able demonstrate normal lumbar flexion and extension range.  Nontender with normal range of motion in all extremities.  Neurologic:  Normal gait without ataxia. Normal speech and  language. No gross focal neurologic deficits are appreciated. Skin:  Skin is warm, dry and intact. No rash noted. ____________________________________________   RADIOLOGY  DG Coccyx  Negative  I, Melvenia Needles, personally viewed and evaluated these images (plain radiographs) as part of my medical decision making, as well as reviewing the written report by the radiologist. ____________________________________________  PROCEDURES  Procedures Norco 5-325 mg PO ____________________________________________  INITIAL IMPRESSION / ASSESSMENT AND PLAN / ED COURSE  Najeeb Uptain was evaluated in Emergency Department on 07/25/2019 for the symptoms described in the history of present illness. He was evaluated in the context of the global COVID-19 pandemic, which necessitated consideration that the patient might be at risk for infection with the SARS-CoV-2 virus that causes COVID-19. Institutional protocols and algorithms that pertain to the evaluation of patients at risk for COVID-19 are in a state of rapid change based on information released by regulatory bodies including the CDC and federal and state organizations. These policies and algorithms were followed during the patient's care in the ED.  Patient with ED evaluation of injury sustained following mechanical fall.  He sustained a contusion to the coccyx without fracture, according to his plain films.  He will be discharged with a prescription for cyclobenzaprine as well as naproxen and #6 hydrocodone.  He will follow with the primary provider or one of the local community clinics for ongoing symptom management.  I reviewed the patient's prescription history over the last 12 months in the multi-state controlled substances database(s) that includes [place], [place], [place], [place], [place], [place], [place], [place], [place], [place], [place], [place], [place], and [place].  Results were notable for  no RX history.  ____________________________________________  FINAL CLINICAL IMPRESSION(S) / ED DIAGNOSES  Final diagnoses:  Fall in home, initial encounter  Coccygeal injury, initial encounter      Melvenia Needles, PA-C 07/25/19 1514    Arta Silence, MD 07/25/19 [phone]

## 2019-07-25 NOTE — Discharge Instructions (Signed)
You have a contusion to your tailbone. There is no evidence of fracture. Take the prescription meds as directed. Apply ice to reduce pain and swelling. Follow-up with [facility] for ongoing care.

## 2020-06-15 ENCOUNTER — Other Ambulatory Visit: Payer: Self-pay

## 2020-06-15 ENCOUNTER — Emergency Department
Admission: EM | Admit: 2020-06-15 | Discharge: 2020-06-15 | Disposition: A | Discharge status: Home / Self Care | Payer: Self-pay | Attending: Emergency Medicine | Admitting: Emergency Medicine

## 2020-06-15 DIAGNOSIS — R290 Tetany: Secondary | ICD-10-CM | POA: Insufficient documentation

## 2020-06-15 DIAGNOSIS — R064 Hyperventilation: Secondary | ICD-10-CM | POA: Insufficient documentation

## 2020-06-15 DIAGNOSIS — F17210 Nicotine dependence, cigarettes, uncomplicated: Secondary | ICD-10-CM | POA: Insufficient documentation

## 2020-06-15 LAB — BASIC METABOLIC PANEL
Anion gap: 11 (ref 5–15)
BUN: 11 mg/dL (ref 6–20)
CO2: 25 mmol/L (ref 22–32)
Calcium: 9.9 mg/dL (ref 8.9–10.3)
Chloride: 105 mmol/L (ref 98–111)
Creatinine, Ser: 0.95 mg/dL (ref 0.61–1.24)
GFR calc Af Amer: >60 mL/min (ref >60)
GFR calc non Af Amer: >60 mL/min (ref >60)
Glucose, Bld: 107 mg/dL — ABNORMAL HIGH (ref 70–99)
Potassium: 3.5 mmol/L (ref 3.5–5.1)
Sodium: 141 mmol/L (ref 135–145)
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
## 2020-06-15 NOTE — ED Provider Notes (Signed)
[facility] Emergency Department Provider Note   ____________________________________________   First MD Initiated Contact with Patient 06/15/20 1835     (approximate)  I have reviewed the triage vital signs and the nursing notes.   HISTORY  Chief Complaint Panic Attack  Chief complaint is panic attack  HPI Louis Blackwell is a 29 y.o. male patient was at work got into a verbal argument with a coworker and then walked away and got apparently anxious was breathing very fast and got carpopedal spasm.  This is all resolved now.  Patient feels better.  He denies any medical problems.  He says his boss made him come in to be checked before he could go back to work.  He does get very sweaty doing his work.  We will get a met be to make sure that his electrolytes are okay.         No past medical history on file.  There are no problems to display for this patient.   No past surgical history on file.  Prior to Admission medications   Medication Sig Start Date End Date Taking? Authorizing Provider  sucralfate (CARAFATE) 1 g tablet Take 1 tablet (1 g total) by mouth 4 (four) times daily as needed (for abdominal discomfort, nausea, and/or vomiting). Patient not taking: Reported on 07/22/2019 11/13/17 07/22/19  Hinda Kehr, MD    Allergies Patient has no known allergies.  No family history on file.  Social History Social History   Tobacco Use  . Smoking status: Current Every Day Smoker    Packs/day: 0.50    Types: Cigarettes  . Smokeless tobacco: Never Used  Substance Use Topics  . Alcohol use: Yes  . Drug use: Yes    Types: Marijuana    Comment: "once in awhile"    Review of Systems  Constitutional: No fever/chills Eyes: No visual changes. ENT: No sore throat. Cardiovascular: Denies chest pain. Respiratory: Denies shortness of breath. Gastrointestinal: No abdominal pain.  No nausea, no vomiting.  No diarrhea.  No  constipation. Genitourinary: Negative for dysuria. Musculoskeletal: Negative for back pain. Skin: Negative for rash. Neurological: Negative for headaches, focal weakness   ____________________________________________   PHYSICAL EXAM:  VITAL SIGNS: ED Triage Vitals  Enc Vitals Group     BP      Pulse      Resp      Temp      Temp src      SpO2      Weight      Height      Head Circumference      Peak Flow      Pain Score      Pain Loc      Pain Edu?      Excl. in [place]?     Constitutional: Alert and oriented. Well appearing and in no acute distress. Eyes: Conjunctivae are normal. PER. EOMI. Head: Atraumatic. Nose: No congestion/rhinnorhea. Mouth/Throat: Mucous membranes are moist.  Oropharynx non-erythematous. Neck: No stridor.  Cardiovascular: Normal rate, regular rhythm. Grossly normal heart sounds.  Good peripheral circulation. Respiratory: Normal respiratory effort.  No retractions. Lungs CTAB. Gastrointestinal: Soft and nontender. No distention. No abdominal bruits. No CVA tenderness. Musculoskeletal: No lower extremity tenderness nor edema.   Neurologic:  Normal speech and language. No gross focal neurologic deficits are appreciated. No gait instability. Skin:  Skin is warm, dry and intact. No rash noted.  ____________________________________________   LABS (all labs ordered are listed, but only abnormal  results are displayed)  Labs Reviewed  BASIC METABOLIC PANEL - Abnormal; Notable for the following components:      Result Value   Glucose, Bld 107 (*)    All other components within normal limits   ____________________________________________  EKG   ____________________________________________  RADIOLOGY  ED MD interpretation:    Official radiology report(s): No results found.  ____________________________________________   PROCEDURES  Procedure(s) performed (including Critical  Care):  Procedures   ____________________________________________   INITIAL IMPRESSION / ASSESSMENT AND PLAN / ED COURSE Patient complains of some pain medial to where his IV is.  It looks like he has some redness where the tourniquet was.  This does not appear to be severe.    ----------------------------------------- 7:32 PM on 06/15/2020 -----------------------------------------  Patient's labs are reassuring patient looks well we will let him go.  He is feeling well.          ____________________________________________   FINAL CLINICAL IMPRESSION(S) / ED DIAGNOSES  Final diagnoses:  Carpopedal spasm  Hyperventilation     ED Discharge Orders    None       Note:  This document was prepared using Dragon voice recognition software and may include unintentional dictation errors.    Nena Polio, MD 06/15/20 [phone]

## 2020-06-15 NOTE — ED Notes (Signed)
Sister waiting in car until able to come back to see pt. [phone]. Nurse Amy states she can come back to 19h around 1900.

## 2020-06-15 NOTE — Discharge Instructions (Addendum)
You are cleared to return to work.  Please make sure you continue to drink enough fluids.  Mixing water and sports drinks are good when you get to be very sweaty.  Please return as needed.  Follow-up with your regular doctor as needed.

## 2021-03-16 DIAGNOSIS — B029 Zoster without complications: Secondary | ICD-10-CM

## 2021-03-16 HISTORY — DX: Zoster without complications: B02.9

## 2021-03-20 ENCOUNTER — Other Ambulatory Visit: Payer: Self-pay

## 2021-03-20 ENCOUNTER — Emergency Department
Admission: EM | Admit: 2021-03-20 | Discharge: 2021-03-20 | Disposition: A | Discharge status: Home / Self Care | Payer: PRIVATE HEALTH INSURANCE | Attending: Emergency Medicine | Admitting: Emergency Medicine

## 2021-03-20 DIAGNOSIS — F17210 Nicotine dependence, cigarettes, uncomplicated: Secondary | ICD-10-CM | POA: Diagnosis not present

## 2021-03-20 DIAGNOSIS — B029 Zoster without complications: Secondary | ICD-10-CM | POA: Diagnosis not present

## 2021-03-20 DIAGNOSIS — R21 Rash and other nonspecific skin eruption: Secondary | ICD-10-CM | POA: Diagnosis present

## 2021-03-20 MED ORDER — PREDNISONE 10 MG (21) PO TBPK: ORAL_TABLET | ORAL | 0 refills | Status: DC

## 2021-03-20 MED ORDER — ACYCLOVIR 400 MG PO TABS: 800.0000 mg | ORAL_TABLET | Freq: Three times a day (TID) | ORAL | 0 refills | Status: AC

## 2021-03-20 MED ORDER — CEPHALEXIN 500 MG PO CAPS: 500.0000 mg | ORAL_CAPSULE | Freq: Three times a day (TID) | ORAL | 0 refills | Status: DC

## 2021-03-20 NOTE — ED Provider Notes (Signed)
[facility] Emergency Department Provider Note  ____________________________________________   Event Date/Time   First MD Initiated Contact with Patient 03/20/21 1401     (approximate)  I have reviewed the triage vital signs and the nursing notes.   HISTORY  Chief Complaint Rash    HPI Louis Blackwell is a 30 y.o. male presents emergency department with complaints of a rash on the left side of the face.  No eye pain.  Patient states rash has been there since last Wednesday and started as a small bump on his eyebrow.  At first thought it was a pimple and tried to pop it and only got clear fluid out.  It has spread into the scalp at this time.  He denies severe pain.    History reviewed. No pertinent past medical history.  There are no problems to display for this patient.   History reviewed. No pertinent surgical history.  Prior to Admission medications   Medication Sig Start Date End Date Taking? Authorizing Provider  acyclovir (ZOVIRAX) 400 MG tablet Take 2 tablets (800 mg total) by mouth 3 (three) times daily for 7 days. 03/20/21 03/27/21 Yes Vegas Fritze, Roselyn Bering, PA-C  cephALEXin (KEFLEX) 500 MG capsule Take 1 capsule (500 mg total) by mouth 3 (three) times daily. 03/20/21  Yes Teige Rountree, Roselyn Bering, PA-C  predniSONE (STERAPRED UNI-PAK 21 TAB) 10 MG (21) TBPK tablet Take 6 pills on day one then decrease by 1 pill each day 03/20/21  Yes Kyran Connaughton, Roselyn Bering, PA-C  sucralfate (CARAFATE) 1 g tablet Take 1 tablet (1 g total) by mouth 4 (four) times daily as needed (for abdominal discomfort, nausea, and/or vomiting). Patient not taking: Reported on 07/22/2019 11/13/17 07/22/19  Loleta Rose, MD    Allergies Patient has no known allergies.  No family history on file.  Social History Social History   Tobacco Use  . Smoking status: Current Every Day Smoker    Packs/day: 0.50    Types: Cigarettes  . Smokeless tobacco: Never Used  Substance Use Topics  . Alcohol use: Yes     Comment: weekend  . Drug use: Yes    Types: Marijuana    Comment: "once in awhile"    Review of Systems  Constitutional: No fever/chills Eyes: No visual changes. ENT: No sore throat. Respiratory: Denies cough Cardiovascular: Denies chest pain Gastrointestinal: Denies abdominal pain Genitourinary: Negative for dysuria. Musculoskeletal: Negative for back pain. Skin: Positive for rash. Psychiatric: no mood changes,     ____________________________________________   PHYSICAL EXAM:  VITAL SIGNS: ED Triage Vitals  Enc Vitals Group     BP 03/20/21 1359 (!) 163/96     Pulse Rate 03/20/21 1359 99     Resp 03/20/21 1359 18     Temp 03/20/21 1359 98 F (36.7 C)     Temp src --      SpO2 03/20/21 1359 98 %     Weight 03/20/21 1358 160 lb (72.6 kg)     Height 03/20/21 1358 5\' 2"  (1.575 m)     Head Circumference --      Peak Flow --      Pain Score 03/20/21 1358 5     Pain Loc --      Pain Edu? --      Excl. in [place]? --     Constitutional: Alert and oriented. Well appearing and in no acute distress. Eyes: Conjunctivae are normal.  Head: Atraumatic. Nose: No congestion/rhinnorhea. Mouth/Throat: Mucous membranes are moist.  Neck:  supple no lymphadenopathy noted Cardiovascular: Normal rate, regular rhythm. Heart sounds are normal Respiratory: Normal respiratory effort.  No retractions, lungs c t a  GU: deferred Musculoskeletal: FROM all extremities, warm and well perfused Neurologic:  Normal speech and language.  Skin:  Skin is warm, dry and intact.  Vesicular rash on erythematous base noted along the left side of the forehead and into the scalp, no drainage but some areas have crusted Psychiatric: Mood and affect are normal. Speech and behavior are normal.  ____________________________________________   LABS (all labs ordered are listed, but only abnormal results are displayed)  Labs Reviewed - No data to  display ____________________________________________   ____________________________________________  RADIOLOGY    ____________________________________________   PROCEDURES  Procedure(s) performed: No  Procedures    ____________________________________________   INITIAL IMPRESSION / ASSESSMENT AND PLAN / ED COURSE  Pertinent labs & imaging results that were available during my care of the patient were reviewed by me and considered in my medical decision making (see chart for details).   Patient is 30 year old male presents with rash.  See HPI.  Physical exam shows patient stable  The appearance of the rash is shingles.  Due to concerns of it being on his face I did instruct him to follow-up with an eye doctor.  He states he has an appointment already scheduled for Friday.  I instructed him to move this up to an earlier date if possible.  Or he could call [facility].  He was given a prescription for acyclovir, prednisone, and Keflex for any secondary infection that might occur due to his constant scratching.  He was discharged in stable condition with instructions to return if worse     Louis Blackwell was evaluated in Emergency Department on 03/20/2021 for the symptoms described in the history of present illness. He was evaluated in the context of the global COVID-19 pandemic, which necessitated consideration that the patient might be at risk for infection with the SARS-CoV-2 virus that causes COVID-19. Institutional protocols and algorithms that pertain to the evaluation of patients at risk for COVID-19 are in a state of rapid change based on information released by regulatory bodies including the CDC and federal and state organizations. These policies and algorithms were followed during the patient's care in the ED.    As part of my medical decision making, I reviewed the following data within the electronic MEDICAL RECORD NUMBER Nursing notes reviewed and incorporated, Old  chart reviewed, Notes from prior ED visits and [place] Controlled Substance Database  ____________________________________________   FINAL CLINICAL IMPRESSION(S) / ED DIAGNOSES  Final diagnoses:  Herpes zoster without complication      NEW MEDICATIONS STARTED DURING THIS VISIT:  New Prescriptions   ACYCLOVIR (ZOVIRAX) 400 MG TABLET    Take 2 tablets (800 mg total) by mouth 3 (three) times daily for 7 days.   CEPHALEXIN (KEFLEX) 500 MG CAPSULE    Take 1 capsule (500 mg total) by mouth 3 (three) times daily.   PREDNISONE (STERAPRED UNI-PAK 21 TAB) 10 MG (21) TBPK TABLET    Take 6 pills on day one then decrease by 1 pill each day     Note:  This document was prepared using Dragon voice recognition software and may include unintentional dictation errors.    Faythe Ghee, PA-C 03/20/21 1418    Chesley Noon, MD 03/20/21 1430

## 2021-03-20 NOTE — ED Notes (Signed)
See triage note   Presents with swelling to left eye with some rash

## 2021-03-20 NOTE — ED Triage Notes (Signed)
Pt comes with c/o rash noted to left side of face. pt also states it is on his head. Pt states  He noticed this last Wednesday with a bump in his eyebrow.  Pt denies any known allergies or coming into contact with anything new.

## 2021-03-20 NOTE — Discharge Instructions (Signed)
Follow-up with a eye doctor soon as possible.  Due to the fact we have shingles in this affected area we have concerns that you can get shingles in your eye which would affect your vision.  This will need to be closely followed by an ophthalmologist  Take the acyclovir, prednisone, and Keflex as prescribed.  Return emergency department worsening.  Take Tylenol or ibuprofen for pain as needed

## 2021-11-01 ENCOUNTER — Other Ambulatory Visit: Payer: Self-pay

## 2021-11-01 ENCOUNTER — Encounter: Payer: Self-pay | Admitting: *Deleted

## 2021-11-01 DIAGNOSIS — Z5321 Procedure and treatment not carried out due to patient leaving prior to being seen by health care provider: Secondary | ICD-10-CM | POA: Diagnosis not present

## 2021-11-01 DIAGNOSIS — R109 Unspecified abdominal pain: Secondary | ICD-10-CM | POA: Diagnosis present

## 2021-11-01 LAB — COMPREHENSIVE METABOLIC PANEL
ALT: 23 U/L (ref 0–44)
AST: 30 U/L (ref 15–41)
Albumin: 5 g/dL (ref 3.5–5.0)
Alkaline Phosphatase: 61 U/L (ref 38–126)
Anion gap: 10 (ref 5–15)
BUN: 13 mg/dL (ref 6–20)
CO2: 23 mmol/L (ref 22–32)
Calcium: 9.4 mg/dL (ref 8.9–10.3)
Chloride: 106 mmol/L (ref 98–111)
Creatinine, Ser: 0.61 mg/dL (ref 0.61–1.24)
GFR, Estimated: >60 mL/min (ref >60)
Glucose, Bld: 91 mg/dL (ref 70–99)
Potassium: 3.7 mmol/L (ref 3.5–5.1)
Sodium: 139 mmol/L (ref 135–145)
Total Bilirubin: 0.4 mg/dL (ref 0.3–1.2)
Total Protein: 7.7 g/dL (ref 6.5–8.1)

## 2021-11-01 LAB — URINALYSIS, ROUTINE W REFLEX MICROSCOPIC
Bilirubin Urine: NEGATIVE
Glucose, UA: NEGATIVE mg/dL
Hgb urine dipstick: NEGATIVE
Ketones, ur: NEGATIVE mg/dL
Leukocytes,Ua: NEGATIVE
Nitrite: NEGATIVE
Protein, ur: NEGATIVE mg/dL
Specific Gravity, Urine: 1.005 (ref 1.005–1.030)
pH: 6 (ref 5.0–8.0)

## 2021-11-01 LAB — CBC
HCT: 42.7 % (ref 39.0–52.0)
Hemoglobin: 14.9 g/dL (ref 13.0–17.0)
MCH: 31.3 pg (ref 26.0–34.0)
MCHC: 34.9 g/dL (ref 30.0–36.0)
MCV: 89.7 fL (ref 80.0–100.0)
Platelets: 174 10*3/uL (ref 150–400)
RBC: 4.76 MIL/uL (ref 4.22–5.81)
RDW: 13.1 % (ref 11.5–15.5)
WBC: 13.4 10*3/uL — ABNORMAL HIGH (ref 4.0–10.5)
nRBC: 0 % (ref 0.0–0.2)

## 2021-11-01 LAB — LIPASE, BLOOD: Lipase: 261 U/L — ABNORMAL HIGH (ref 11–51)

## 2021-11-01 NOTE — ED Triage Notes (Signed)
Pt has right side abd pain.  No back pain  no n/v/d  sx began today.  Pt alert speech clear.

## 2021-11-01 NOTE — ED Notes (Signed)
Observed pt walking out of lobby doors, swearing, stating he's tired of waiting

## 2021-11-02 ENCOUNTER — Emergency Department
Admission: EM | Admit: 2021-11-02 | Discharge: 2021-11-02 | Disposition: A | Discharge status: Home / Self Care | Payer: PRIVATE HEALTH INSURANCE | Attending: Emergency Medicine | Admitting: Emergency Medicine

## 2021-11-12 ENCOUNTER — Other Ambulatory Visit: Payer: Self-pay | Admitting: Family

## 2021-11-12 DIAGNOSIS — R1909 Other intra-abdominal and pelvic swelling, mass and lump: Secondary | ICD-10-CM

## 2021-11-15 ENCOUNTER — Other Ambulatory Visit: Payer: Self-pay

## 2021-11-15 ENCOUNTER — Ambulatory Visit
Admission: RE | Admit: 2021-11-15 | Discharge: 2021-11-15 | Disposition: A | Discharge status: Home / Self Care | Payer: PRIVATE HEALTH INSURANCE | Source: Ambulatory Visit | Attending: Family | Admitting: Family

## 2021-11-15 DIAGNOSIS — R1909 Other intra-abdominal and pelvic swelling, mass and lump: Secondary | ICD-10-CM | POA: Diagnosis present

## 2021-11-19 ENCOUNTER — Emergency Department
Admission: EM | Admit: 2021-11-19 | Discharge: 2021-11-19 | Disposition: A | Discharge status: Home / Self Care | Payer: PRIVATE HEALTH INSURANCE | Attending: Emergency Medicine | Admitting: Emergency Medicine

## 2021-11-19 ENCOUNTER — Other Ambulatory Visit: Payer: Self-pay

## 2021-11-19 DIAGNOSIS — F17210 Nicotine dependence, cigarettes, uncomplicated: Secondary | ICD-10-CM | POA: Insufficient documentation

## 2021-11-19 DIAGNOSIS — R1031 Right lower quadrant pain: Secondary | ICD-10-CM | POA: Diagnosis present

## 2021-11-19 DIAGNOSIS — K4090 Unilateral inguinal hernia, without obstruction or gangrene, not specified as recurrent: Secondary | ICD-10-CM | POA: Diagnosis not present

## 2021-11-19 LAB — CBC WITH DIFFERENTIAL/PLATELET
Abs Immature Granulocytes: 0.04 10*3/uL (ref 0.00–0.07)
Basophils Absolute: 0.1 10*3/uL (ref 0.0–0.1)
Basophils Relative: 1 %
Eosinophils Absolute: 0.4 10*3/uL (ref 0.0–0.5)
Eosinophils Relative: 4 %
HCT: 45.1 % (ref 39.0–52.0)
Hemoglobin: 14.8 g/dL (ref 13.0–17.0)
Immature Granulocytes: 0 %
Lymphocytes Relative: 23 %
Lymphs Abs: 2.6 10*3/uL (ref 0.7–4.0)
MCH: 30.1 pg (ref 26.0–34.0)
MCHC: 32.8 g/dL (ref 30.0–36.0)
MCV: 91.7 fL (ref 80.0–100.0)
Monocytes Absolute: 0.6 10*3/uL (ref 0.1–1.0)
Monocytes Relative: 5 %
Neutro Abs: 7.8 10*3/uL — ABNORMAL HIGH (ref 1.7–7.7)
Neutrophils Relative %: 67 %
Platelets: 168 10*3/uL (ref 150–400)
RBC: 4.92 MIL/uL (ref 4.22–5.81)
RDW: 13.3 % (ref 11.5–15.5)
WBC: 11.4 10*3/uL — ABNORMAL HIGH (ref 4.0–10.5)
nRBC: 0 % (ref 0.0–0.2)

## 2021-11-19 LAB — COMPREHENSIVE METABOLIC PANEL
ALT: 45 U/L — ABNORMAL HIGH (ref 0–44)
AST: 33 U/L (ref 15–41)
Albumin: 4.8 g/dL (ref 3.5–5.0)
Alkaline Phosphatase: 61 U/L (ref 38–126)
Anion gap: 8 (ref 5–15)
BUN: 7 mg/dL (ref 6–20)
CO2: 26 mmol/L (ref 22–32)
Calcium: 9.5 mg/dL (ref 8.9–10.3)
Chloride: 106 mmol/L (ref 98–111)
Creatinine, Ser: 0.74 mg/dL (ref 0.61–1.24)
GFR, Estimated: >60 mL/min (ref >60)
Glucose, Bld: 96 mg/dL (ref 70–99)
Potassium: 4.1 mmol/L (ref 3.5–5.1)
Sodium: 140 mmol/L (ref 135–145)
Total Bilirubin: 0.5 mg/dL (ref 0.3–1.2)
Total Protein: 7.6 g/dL (ref 6.5–8.1)

## 2021-11-19 LAB — URINALYSIS, ROUTINE W REFLEX MICROSCOPIC
Bilirubin Urine: NEGATIVE
Glucose, UA: NEGATIVE mg/dL
Hgb urine dipstick: NEGATIVE
Ketones, ur: NEGATIVE mg/dL
Leukocytes,Ua: NEGATIVE
Nitrite: NEGATIVE
Protein, ur: NEGATIVE mg/dL
Specific Gravity, Urine: 1.001 — ABNORMAL LOW (ref 1.005–1.030)
pH: 7 (ref 5.0–8.0)

## 2021-11-19 NOTE — ED Provider Notes (Signed)
Emergency Medicine Provider Triage Evaluation Note  Louis Blackwell , a 30 y.o. male  was evaluated in triage.  Pt complains of worsening right inguinal pain.  Patient has a history of a right inguinal hernia, is having increased pain.  No emesis, constipation.  No fevers or chills.  No urinary symptoms.  Review of Systems  Positive: Right inguinal pain, known hernia Negative: Fevers, emesis, constipation  Physical Exam  BP (!) 123/107 (BP Location: Left Arm)   Pulse (!) 112   Temp 98.3 F (36.8 C) (Oral)   Resp 20   Ht 5\' 4"  (1.626 m)   Wt 72.6 kg   SpO2 100%   BMI 27.46 kg/m  Gen:   Awake, no distress   Resp:  Normal effort  MSK:   Moves extremities without difficulty  Other:  Tenderness in the right inguinal region with palpable hernia  Medical Decision Making  Medically screening exam initiated at 4:50 PM.  Appropriate orders placed.  Louis Blackwell was informed that the remainder of the evaluation will be completed by another provider, this initial triage assessment does not replace that evaluation, and the importance of remaining in the ED until their evaluation is complete.  Patient presents with worsening right sided inguinal hernia pain.  Patient has a known inguinal hernia.  Worsening pain.  No fever, no emesis, no constipation, no urinary symptoms.  Patient will have labs, urinalysis and CT scan   Aquilla Hacker 11/19/21 1650    14/05/22, MD 11/19/21 2000

## 2021-11-19 NOTE — ED Triage Notes (Signed)
Pt in with co RLQ pain states found out last week he had a hernia after he had outpt ultrasound. Pt here for worsening pain.

## 2021-11-19 NOTE — ED Provider Notes (Signed)
[facility] Emergency Department Provider Note   ____________________________________________    I have reviewed the triage vital signs and the nursing notes.   HISTORY  Chief Complaint Abdominal Pain     HPI Louis Blackwell is a 30 y.o. male who presents with complaints of right groin burning sensation.  Patient recently diagnosed with a hernia, he reports it is typically sore after a days work, he does lots of heavy lifting in his job.  He denies nausea vomiting.  Normal stools.  Has not take anything for this.  Has not seen general surgery.  No other complaints at this time.  No past medical history on file.  There are no problems to display for this patient.   No past surgical history on file.  Prior to Admission medications   Medication Sig Start Date End Date Taking? Authorizing Provider  cephALEXin (KEFLEX) 500 MG capsule Take 1 capsule (500 mg total) by mouth 3 (three) times daily. 03/20/21   Fisher, Roselyn Bering, PA-C  predniSONE (STERAPRED UNI-PAK 21 TAB) 10 MG (21) TBPK tablet Take 6 pills on day one then decrease by 1 pill each day 03/20/21   Faythe Ghee, PA-C  sucralfate (CARAFATE) 1 g tablet Take 1 tablet (1 g total) by mouth 4 (four) times daily as needed (for abdominal discomfort, nausea, and/or vomiting). Patient not taking: Reported on 07/22/2019 11/13/17 07/22/19  Loleta Rose, MD     Allergies Patient has no known allergies.  No family history on file.  Social History Social History   Tobacco Use   Smoking status: Every Day    Packs/day: 0.50    Types: Cigarettes   Smokeless tobacco: Never  Substance Use Topics   Alcohol use: Yes    Comment: weekend   Drug use: Yes    Types: Marijuana    Comment: "once in awhile"    Review of Systems  Constitutional: No fever/chills Eyes: No visual changes.  ENT: No sore throat. Cardiovascular: Denies chest pain. Respiratory: Denies shortness of breath. Gastrointestinal: As  above Genitourinary: Negative for dysuria. Musculoskeletal: Negative for back pain. Skin: Negative for rash. Neurological: Negative for headaches or weakness   ____________________________________________   PHYSICAL EXAM:  VITAL SIGNS: ED Triage Vitals  Enc Vitals Group     BP 11/19/21 1644 (!) 123/107     Pulse Rate 11/19/21 1644 (!) 112     Resp 11/19/21 1644 20     Temp 11/19/21 1644 98.3 F (36.8 C)     Temp Source 11/19/21 1644 Oral     SpO2 11/19/21 1644 100 %     Weight 11/19/21 1645 72.6 kg (160 lb)     Height 11/19/21 1645 1.626 m (5\' 4" )     Head Circumference --      Peak Flow --      Pain Score 11/19/21 1644 10     Pain Loc --      Pain Edu? --      Excl. in [place]? --     Constitutional: Alert and oriented. N Eyes: Conjunctivae are normal.   Nose: No congestion/rhinnorhea. Mouth/Throat: Mucous membranes are moist.    Cardiovascular: Normal rate, regular rhythm. Grossly normal heart sounds.  Good peripheral circulation. Respiratory: Normal respiratory effort.  No retractions. Lungs CTAB. Gastrointestinal: Soft and nontender. No distention.  No CVA tenderness. Genitourinary: Right groin, easily reducible soft hernia, nontender Musculoskeletal: No lower extremity tenderness nor edema.  Warm and well perfused Neurologic:  Normal speech  and language. No gross focal neurologic deficits are appreciated.  Skin:  Skin is warm, dry and intact. No rash noted. Psychiatric: Mood and affect are normal. Speech and behavior are normal.  ____________________________________________   LABS (all labs ordered are listed, but only abnormal results are displayed)  Labs Reviewed  URINALYSIS, ROUTINE W REFLEX MICROSCOPIC - Abnormal; Notable for the following components:      Result Value   Color, Urine COLORLESS (*)    APPearance CLEAR (*)    Specific Gravity, Urine 1.001 (*)    All other components within normal limits  COMPREHENSIVE METABOLIC PANEL - Abnormal; Notable  for the following components:   ALT 45 (*)    All other components within normal limits  CBC WITH DIFFERENTIAL/PLATELET - Abnormal; Notable for the following components:   WBC 11.4 (*)    Neutro Abs 7.8 (*)    All other components within normal limits   ____________________________________________  EKG   ____________________________________________  RADIOLOGY   ____________________________________________   PROCEDURES  Procedure(s) performed: No  Procedures   Critical Care performed: No ____________________________________________   INITIAL IMPRESSION / ASSESSMENT AND PLAN / ED COURSE  Pertinent labs & imaging results that were available during my care of the patient were reviewed by me and considered in my medical decision making (see chart for details).   Patient presents with discomfort from hernia, however hernia is easily reducible, nontender  Lab work today is quite reassuring, no indication for imaging at this time, patient just had ultrasound 4 days ago, will refer to general surgery for outpatient evaluation and possible repair      ____________________________________________   FINAL CLINICAL IMPRESSION(S) / ED DIAGNOSES  Final diagnoses:  Unilateral inguinal hernia without obstruction or gangrene, recurrence not specified        Note:  This document was prepared using Dragon voice recognition software and may include unintentional dictation errors.    Jene Every, MD 11/19/21 [phone]

## 2021-11-21 ENCOUNTER — Ambulatory Visit: Payer: Self-pay | Admitting: Surgery

## 2021-11-21 NOTE — H&P (View-Only) (Signed)
Subjective:  CC: Non-recurrent unilateral inguinal hernia without obstruction or gangrene [K40.90]  HPI:  Louis Blackwell is a 30 y.o. male who was referred by [facility] for evaluation of above. Symptoms were first noted several weeks ago. Pain is dull, intermittent and discomfort, confined to the right groin, without radiation.  Associated with nothing, exacerbated by nothing  Lump is reducible.    Past Medical History: hx of alcohol abuse  Past Surgical History: none reported  Family History: reviewed and not relevant to CC  Social History: 1/2PPD smoker, 4-6can of beer/day, decreased from past, uses marjiuana, denies other illicit drugs  Current Medications: none reported  Allergies:  Allergies as of 11/21/2021 - Reviewed 11/21/2021 Allergen Reaction Noted  Grass pollen-june grass standard Hives 11/21/2021   ROS:  A 15 point review of systems was performed and pertinent positives and negatives noted in HPI   Objective:    BP 139/88   Pulse 78   Ht 165.1 cm (5' 5") Comment: per ;pt  Wt 72.6 kg (160 lb) Comment: per pt  BMI 26.63 kg/m   Constitutional :  Alert, cooperative, no distress Lymphatics/Throat:  Supple, no lymphadenopathy Respiratory:  clear to auscultation bilaterally Cardiovascular:  regular rate and rhythm Gastrointestinal: soft, non-tender; bowel sounds normal; no masses,  no organomegaly. inguinal hernia noted.  small, reducible, no overlying skin changes and RIGHT Musculoskeletal: Steady gait and movement Skin: Cool and moist, no surgical scars Psychiatric: Normal affect, non-agitated, not confused     LABS:  n/a   RADS: n/a Assessment:      Non-recurrent unilateral inguinal hernia without obstruction or gangrene [K40.90]  Plan:    1. Non-recurrent unilateral inguinal hernia without obstruction or gangrene [K40.90]   Discussed the risk of surgery including recurrence, which can be up to 50% in the case of incisional or complex hernias, possible  use of prosthetic materials (mesh) and the increased risk of mesh infxn if used, bleeding, chronic pain, post-op infxn, post-op SBO or ileus, and possible re-operation to address said risks. The risks of general anesthetic, if used, includes MI, CVA, sudden death or even reaction to anesthetic medications also discussed. Alternatives include continued observation.  Benefits include possible symptom relief, prevention of incarceration, strangulation, enlargement in size over time, and the risk of emergency surgery in the face of strangulation.   Typical post-op recovery time of 3-5 days with 2 weeks of activity restrictions were also discussed.  ED return precautions given for sudden increase in pain, size of hernia with accompanying fever, nausea, and/or vomiting.  The patient verbalized understanding and all questions were answered to the patient's satisfaction.   2. Patient has elected to proceed with surgical treatment. Procedure will be scheduled.  Right, robotic assisted laparoscopic.  Strongly encouraged to cut down on alcohol use, both for short and long term benefits.  He verbalized understanding and stated he will try.  Will check coag with labs today.    

## 2021-11-21 NOTE — H&P (Signed)
Subjective:  CC: Non-recurrent unilateral inguinal hernia without obstruction or gangrene [K40.90]  HPI:  Louis Blackwell is a 30 y.o. male who was referred by Cyril Loosen for evaluation of above. Symptoms were first noted several weeks ago. Pain is dull, intermittent and discomfort, confined to the right groin, without radiation.  Associated with nothing, exacerbated by nothing  Lump is reducible.    Past Medical History: hx of alcohol abuse  Past Surgical History: none reported  Family History: reviewed and not relevant to CC  Social History: 1/2PPD smoker, 4-6can of beer/day, decreased from past, uses marjiuana, denies other illicit drugs  Current Medications: none reported  Allergies:  Allergies as of 11/21/2021 - Reviewed 11/21/2021 Allergen Reaction Noted  Grass pollen-june grass standard Hives 11/21/2021   ROS:  A 15 point review of systems was performed and pertinent positives and negatives noted in HPI   Objective:    BP 139/88   Pulse 78   Ht 165.1 cm (5\' 5" ) Comment: per ;pt  Wt 72.6 kg (160 lb) Comment: per pt  BMI 26.63 kg/m   Constitutional :  Alert, cooperative, no distress Lymphatics/Throat:  Supple, no lymphadenopathy Respiratory:  clear to auscultation bilaterally Cardiovascular:  regular rate and rhythm Gastrointestinal: soft, non-tender; bowel sounds normal; no masses,  no organomegaly. inguinal hernia noted.  small, reducible, no overlying skin changes and RIGHT Musculoskeletal: Steady gait and movement Skin: Cool and moist, no surgical scars Psychiatric: Normal affect, non-agitated, not confused     LABS:  n/a   RADS: n/a Assessment:      Non-recurrent unilateral inguinal hernia without obstruction or gangrene [K40.90]  Plan:    1. Non-recurrent unilateral inguinal hernia without obstruction or gangrene [K40.90]   Discussed the risk of surgery including recurrence, which can be up to 50% in the case of incisional or complex hernias, possible  use of prosthetic materials (mesh) and the increased risk of mesh infxn if used, bleeding, chronic pain, post-op infxn, post-op SBO or ileus, and possible re-operation to address said risks. The risks of general anesthetic, if used, includes MI, CVA, sudden death or even reaction to anesthetic medications also discussed. Alternatives include continued observation.  Benefits include possible symptom relief, prevention of incarceration, strangulation, enlargement in size over time, and the risk of emergency surgery in the face of strangulation.   Typical post-op recovery time of 3-5 days with 2 weeks of activity restrictions were also discussed.  ED return precautions given for sudden increase in pain, size of hernia with accompanying fever, nausea, and/or vomiting.  The patient verbalized understanding and all questions were answered to the patient's satisfaction.   2. Patient has elected to proceed with surgical treatment. Procedure will be scheduled.  Right, robotic assisted laparoscopic.  Strongly encouraged to cut down on alcohol use, both for short and long term benefits.  He verbalized understanding and stated he will try.  Will check coag with labs today.

## 2021-11-26 ENCOUNTER — Other Ambulatory Visit: Payer: Self-pay

## 2021-11-26 ENCOUNTER — Encounter
Admission: RE | Admit: 2021-11-26 | Discharge: 2021-11-26 | Disposition: A | Discharge status: Home / Self Care | Payer: PRIVATE HEALTH INSURANCE | Source: Ambulatory Visit | Attending: Surgery | Admitting: Surgery

## 2021-11-26 HISTORY — DX: Headache, unspecified: R51.9

## 2021-11-26 HISTORY — DX: Gastro-esophageal reflux disease without esophagitis: K21.9

## 2021-11-26 NOTE — Patient Instructions (Signed)
Your procedure is scheduled on:11-29-21 Thursday Report to the Registration Desk on the 1st floor of the [facility].Then proceed to the 2nd floor Surgery Desk in the Medical [facility] To find out your arrival time, please call [phone] between 1PM - 3PM on:11-28-21 Wednesday  REMEMBER: Instructions that are not followed completely may result in serious medical risk, up to and including death; or upon the discretion of your surgeon and anesthesiologist your surgery may need to be rescheduled.  Do not eat food after midnight the night before surgery.  No gum chewing, lozengers or hard candies.  You may however, drink CLEAR liquids up to 2 hours before you are scheduled to arrive for your surgery. Do not drink anything within 2 hours of your scheduled arrival time.  Clear liquids include: - water  - apple juice without pulp - gatorade (not RED, PURPLE, OR BLUE) - black coffee or tea (Do NOT add milk or creamers to the coffee or tea) Do NOT drink anything that is not on this list.  Do not take any medication the day of surgery  One week prior to surgery: Stop Anti-inflammatories (NSAIDS) such as Advil, Aleve, Ibuprofen, Motrin, Naproxen, Naprosyn and Aspirin based products such as Excedrin, Goodys Powder, BC Powder.You may however, continue to take Tylenol if needed for pain up until the day of surgery.  Stop ANY OVER THE COUNTER supplements/vitamins NOW (11-26-21) until after surgery.  No Alcohol for 24 hours before or after surgery.  No Smoking including e-cigarettes for 24 hours prior to surgery.  No chewable tobacco products for at least 6 hours prior to surgery.  No nicotine patches on the day of surgery.  Do not use any "recreational" drugs for at least a week prior to your surgery.  Please be advised that the combination of cocaine and anesthesia may have negative outcomes, up to and including death. If you test positive for cocaine, your surgery will be cancelled.  On the  morning of surgery brush your teeth with toothpaste and water, you may rinse your mouth with mouthwash if you wish. Do not swallow any toothpaste or mouthwash.  Use CHG Soap as directed on instruction sheet.  Do not wear jewelry, make-up, hairpins, clips or nail polish.  Do not wear lotions, powders, or perfumes.   Do not shave body from the neck down 48 hours prior to surgery just in case you cut yourself which could leave a site for infection.  Also, freshly shaved skin may become irritated if using the CHG soap.  Contact lenses, hearing aids and dentures may not be worn into surgery.  Do not bring valuables to the hospital. [facility] is not responsible for any missing/lost belongings or valuables.   Notify your doctor if there is any change in your medical condition (cold, fever, infection).  Wear comfortable clothing (specific to your surgery type) to the hospital.  After surgery, you can help prevent lung complications by doing breathing exercises.  Take deep breaths and cough every 1-2 hours. Your doctor may order a device called an Incentive Spirometer to help you take deep breaths. When coughing or sneezing, hold a pillow firmly against your incision with both hands. This is called "splinting." Doing this helps protect your incision. It also decreases belly discomfort.  If you are being admitted to the hospital overnight, leave your suitcase in the car. After surgery it may be brought to your room.  If you are being discharged the day of surgery, you will not be  allowed to drive home. You will need a responsible adult (18 years or older) to drive you home and stay with you that night.   If you are taking public transportation, you will need to have a responsible adult (18 years or older) with you. Please confirm with your physician that it is acceptable to use public transportation.   Please call the Pre-admissions Testing Dept. at [phone] if you have any questions  about these instructions.  Surgery Visitation Policy:  Patients undergoing a surgery or procedure may have one family member or support person with them as long as that person is not COVID-19 positive or experiencing its symptoms.  That person may remain in the waiting area during the procedure and may rotate out with other people.  Inpatient Visitation:    Visiting hours are 7 a.m. to 8 p.m. Up to two visitors ages 16+ are allowed at one time in a patient room. The visitors may rotate out with other people during the day. Visitors must check out when they leave, or other visitors will not be allowed. One designated support person may remain overnight. The visitor must pass COVID-19 screenings, use hand sanitizer when entering and exiting the patient's room and wear a mask at all times, including in the patient's room. Patients must also wear a mask when staff or their visitor are in the room. Masking is required regardless of vaccination status.

## 2021-11-29 ENCOUNTER — Encounter
Admission: RE | Disposition: A | Discharge status: Home / Self Care | Payer: Self-pay | Source: Home / Self Care | Attending: Surgery

## 2021-11-29 ENCOUNTER — Ambulatory Visit: Payer: PRIVATE HEALTH INSURANCE | Admitting: Certified Registered"

## 2021-11-29 ENCOUNTER — Ambulatory Visit
Admission: RE | Admit: 2021-11-29 | Discharge: 2021-11-29 | Disposition: A | Discharge status: Home / Self Care | Payer: PRIVATE HEALTH INSURANCE | Attending: Surgery | Admitting: Surgery

## 2021-11-29 ENCOUNTER — Encounter: Payer: Self-pay | Admitting: Surgery

## 2021-11-29 ENCOUNTER — Other Ambulatory Visit: Payer: Self-pay

## 2021-11-29 DIAGNOSIS — F17210 Nicotine dependence, cigarettes, uncomplicated: Secondary | ICD-10-CM | POA: Insufficient documentation

## 2021-11-29 DIAGNOSIS — K4090 Unilateral inguinal hernia, without obstruction or gangrene, not specified as recurrent: Secondary | ICD-10-CM | POA: Insufficient documentation

## 2021-11-29 DIAGNOSIS — K219 Gastro-esophageal reflux disease without esophagitis: Secondary | ICD-10-CM | POA: Insufficient documentation

## 2021-11-29 SURGERY — HERNIORRHAPHY, INGUINAL, ROBOT-ASSISTED, LAPAROSCOPIC
Anesthesia: General | Site: Groin | Laterality: Right

## 2021-11-29 MED ORDER — LIDOCAINE HCL (PF) 1 % IJ SOLN
INTRAMUSCULAR | Status: AC
  Filled 2021-11-29: qty 30

## 2021-11-29 MED ORDER — CHLORHEXIDINE GLUCONATE 0.12 % MT SOLN
OROMUCOSAL | Status: AC
  Administered 2021-11-29: 15 mL via OROMUCOSAL
  Filled 2021-11-29: qty 15

## 2021-11-29 MED ORDER — CELECOXIB 200 MG PO CAPS
ORAL_CAPSULE | ORAL | Status: AC
  Administered 2021-11-29: 200 mg via ORAL
  Filled 2021-11-29: qty 1

## 2021-11-29 MED ORDER — GABAPENTIN 300 MG PO CAPS
ORAL_CAPSULE | ORAL | Status: AC
  Administered 2021-11-29: 300 mg via ORAL
  Filled 2021-11-29: qty 1

## 2021-11-29 MED ORDER — BUPIVACAINE LIPOSOME 1.3 % IJ SUSP
INTRAMUSCULAR | Status: AC
  Filled 2021-11-29: qty 10

## 2021-11-29 MED ORDER — OXYCODONE HCL 5 MG PO TABS
5.0000 mg | ORAL_TABLET | Freq: Once | ORAL | Status: AC | PRN
  Administered 2021-11-29: 5 mg via ORAL

## 2021-11-29 MED ORDER — MIDAZOLAM HCL 2 MG/2ML IJ SOLN
INTRAMUSCULAR | Status: DC | PRN
  Administered 2021-11-29: 2 mg via INTRAVENOUS

## 2021-11-29 MED ORDER — DEXAMETHASONE SODIUM PHOSPHATE 10 MG/ML IJ SOLN
INTRAMUSCULAR | Status: DC | PRN
  Administered 2021-11-29: 10 mg via INTRAVENOUS

## 2021-11-29 MED ORDER — ONDANSETRON HCL 4 MG/2ML IJ SOLN
INTRAMUSCULAR | Status: DC | PRN
  Administered 2021-11-29: 4 mg via INTRAVENOUS

## 2021-11-29 MED ORDER — FAMOTIDINE 20 MG PO TABS
ORAL_TABLET | ORAL | Status: AC
  Administered 2021-11-29: 20 mg via ORAL
  Filled 2021-11-29: qty 1

## 2021-11-29 MED ORDER — ROCURONIUM BROMIDE 10 MG/ML (PF) SYRINGE
PREFILLED_SYRINGE | INTRAVENOUS | Status: AC
  Filled 2021-11-29: qty 10

## 2021-11-29 MED ORDER — MIDAZOLAM HCL 2 MG/2ML IJ SOLN
INTRAMUSCULAR | Status: AC
  Filled 2021-11-29: qty 2

## 2021-11-29 MED ORDER — LACTATED RINGERS IV SOLN: INTRAVENOUS | Status: DC

## 2021-11-29 MED ORDER — FENTANYL CITRATE (PF) 100 MCG/2ML IJ SOLN
INTRAMUSCULAR | Status: AC
  Filled 2021-11-29: qty 2

## 2021-11-29 MED ORDER — KETAMINE HCL 10 MG/ML IJ SOLN
INTRAMUSCULAR | Status: DC | PRN
  Administered 2021-11-29: 30 mg via INTRAVENOUS
  Administered 2021-11-29: 20 mg via INTRAVENOUS

## 2021-11-29 MED ORDER — SUGAMMADEX SODIUM 200 MG/2ML IV SOLN
INTRAVENOUS | Status: DC | PRN
  Administered 2021-11-29: 200 mg via INTRAVENOUS

## 2021-11-29 MED ORDER — KETAMINE HCL 50 MG/5ML IJ SOSY
PREFILLED_SYRINGE | INTRAMUSCULAR | Status: AC
  Filled 2021-11-29: qty 5

## 2021-11-29 MED ORDER — CEFAZOLIN SODIUM-DEXTROSE 2-4 GM/100ML-% IV SOLN
INTRAVENOUS | Status: AC
  Filled 2021-11-29: qty 100

## 2021-11-29 MED ORDER — LIDOCAINE HCL (PF) 2 % IJ SOLN
INTRAMUSCULAR | Status: AC
  Filled 2021-11-29: qty 5

## 2021-11-29 MED ORDER — OXYCODONE HCL 5 MG/5ML PO SOLN: 5.0000 mg | Freq: Once | ORAL | Status: AC | PRN

## 2021-11-29 MED ORDER — FENTANYL CITRATE (PF) 100 MCG/2ML IJ SOLN
INTRAMUSCULAR | Status: DC | PRN
  Administered 2021-11-29: 100 ug via INTRAVENOUS

## 2021-11-29 MED ORDER — ROCURONIUM BROMIDE 100 MG/10ML IV SOLN
INTRAVENOUS | Status: DC | PRN
  Administered 2021-11-29: 60 mg via INTRAVENOUS

## 2021-11-29 MED ORDER — CELECOXIB 200 MG PO CAPS: 200.0000 mg | ORAL_CAPSULE | ORAL | Status: AC

## 2021-11-29 MED ORDER — ACETAMINOPHEN 500 MG PO TABS
ORAL_TABLET | ORAL | Status: AC
  Administered 2021-11-29: 1000 mg via ORAL
  Filled 2021-11-29: qty 2

## 2021-11-29 MED ORDER — ORAL CARE MOUTH RINSE: 15.0000 mL | Freq: Once | OROMUCOSAL | Status: AC

## 2021-11-29 MED ORDER — ACETAMINOPHEN 500 MG PO TABS: 1000.0000 mg | ORAL_TABLET | ORAL | Status: AC

## 2021-11-29 MED ORDER — FAMOTIDINE 20 MG PO TABS: 20.0000 mg | ORAL_TABLET | Freq: Once | ORAL | Status: AC

## 2021-11-29 MED ORDER — CHLORHEXIDINE GLUCONATE CLOTH 2 % EX PADS: 6.0000 | MEDICATED_PAD | Freq: Once | CUTANEOUS | Status: DC

## 2021-11-29 MED ORDER — DOCUSATE SODIUM 100 MG PO CAPS: 100.0000 mg | ORAL_CAPSULE | Freq: Two times a day (BID) | ORAL | 0 refills | Status: AC | PRN

## 2021-11-29 MED ORDER — LIDOCAINE HCL 1 % IJ SOLN
INTRAMUSCULAR | Status: DC | PRN
  Administered 2021-11-29: 10 mL via INTRAMUSCULAR
  Administered 2021-11-29: 14 mL via INTRAMUSCULAR

## 2021-11-29 MED ORDER — OXYCODONE HCL 5 MG PO TABS
ORAL_TABLET | ORAL | Status: AC
  Filled 2021-11-29: qty 1

## 2021-11-29 MED ORDER — HYDROCODONE-ACETAMINOPHEN 5-325 MG PO TABS
1.0000 | ORAL_TABLET | Freq: Four times a day (QID) | ORAL | 0 refills | Status: DC | PRN
Start: 2021-11-29 — End: 2021-12-19

## 2021-11-29 MED ORDER — ONDANSETRON HCL 4 MG/2ML IJ SOLN: 4.0000 mg | Freq: Once | INTRAMUSCULAR | Status: DC | PRN

## 2021-11-29 MED ORDER — IBUPROFEN 800 MG PO TABS: 800.0000 mg | ORAL_TABLET | Freq: Three times a day (TID) | ORAL | 0 refills | Status: DC | PRN

## 2021-11-29 MED ORDER — DEXAMETHASONE SODIUM PHOSPHATE 10 MG/ML IJ SOLN
INTRAMUSCULAR | Status: AC
  Filled 2021-11-29: qty 1

## 2021-11-29 MED ORDER — CEFAZOLIN SODIUM-DEXTROSE 2-4 GM/100ML-% IV SOLN
2.0000 g | INTRAVENOUS | Status: AC
  Administered 2021-11-29: 2 g via INTRAVENOUS

## 2021-11-29 MED ORDER — CHLORHEXIDINE GLUCONATE 0.12 % MT SOLN: 15.0000 mL | Freq: Once | OROMUCOSAL | Status: AC

## 2021-11-29 MED ORDER — ACETAMINOPHEN 325 MG PO TABS: 650.0000 mg | ORAL_TABLET | Freq: Three times a day (TID) | ORAL | 0 refills | Status: DC | PRN

## 2021-11-29 MED ORDER — PHENYLEPHRINE HCL (PRESSORS) 10 MG/ML IV SOLN
INTRAVENOUS | Status: DC | PRN
  Administered 2021-11-29 (×2): 100 ug via INTRAVENOUS

## 2021-11-29 MED ORDER — BUPIVACAINE-EPINEPHRINE (PF) 0.5% -1:200000 IJ SOLN
INTRAMUSCULAR | Status: AC
  Filled 2021-11-29: qty 30

## 2021-11-29 MED ORDER — GABAPENTIN 300 MG PO CAPS: 300.0000 mg | ORAL_CAPSULE | ORAL | Status: AC

## 2021-11-29 MED ORDER — DEXMEDETOMIDINE HCL IN NACL 200 MCG/50ML IV SOLN
INTRAVENOUS | Status: DC | PRN
  Administered 2021-11-29: 12 ug via INTRAVENOUS
  Administered 2021-11-29 (×2): 8 ug via INTRAVENOUS
  Administered 2021-11-29: 12 ug via INTRAVENOUS

## 2021-11-29 MED ORDER — PROPOFOL 10 MG/ML IV BOLUS
INTRAVENOUS | Status: DC | PRN
  Administered 2021-11-29: 100 mg via INTRAVENOUS
  Administered 2021-11-29: 200 mg via INTRAVENOUS

## 2021-11-29 MED ORDER — LIDOCAINE HCL (CARDIAC) PF 100 MG/5ML IV SOSY
PREFILLED_SYRINGE | INTRAVENOUS | Status: DC | PRN
  Administered 2021-11-29: 100 mg via INTRAVENOUS

## 2021-11-29 MED ORDER — FENTANYL CITRATE (PF) 100 MCG/2ML IJ SOLN
25.0000 ug | INTRAMUSCULAR | Status: DC | PRN
  Administered 2021-11-29 (×3): 50 ug via INTRAVENOUS

## 2021-11-29 MED ORDER — ONDANSETRON HCL 4 MG/2ML IJ SOLN
INTRAMUSCULAR | Status: AC
  Filled 2021-11-29: qty 2

## 2021-11-29 MED ORDER — PROPOFOL 10 MG/ML IV BOLUS
INTRAVENOUS | Status: AC
  Filled 2021-11-29: qty 40

## 2021-11-29 SURGICAL SUPPLY — 48 items
ADH SKN CLS APL DERMABOND .7 (GAUZE/BANDAGES/DRESSINGS) ×1
APL PRP STRL LF DISP 70% ISPRP (MISCELLANEOUS) ×1
BAG INFUSER PRESSURE 100CC (MISCELLANEOUS) IMPLANT
BLADE SURG SZ11 CARB STEEL (BLADE) ×2 IMPLANT
BNDG GAUZE ELAST 4 BULKY (GAUZE/BANDAGES/DRESSINGS) ×2 IMPLANT
CHLORAPREP W/TINT 26 (MISCELLANEOUS) ×2 IMPLANT
COVER TIP SHEARS 8 DVNC (MISCELLANEOUS) ×1 IMPLANT
COVER TIP SHEARS 8MM DA VINCI (MISCELLANEOUS) ×1
DEFOGGER SCOPE WARMER CLEARIFY (MISCELLANEOUS) ×2 IMPLANT
DERMABOND ADVANCED (GAUZE/BANDAGES/DRESSINGS) ×1
DERMABOND ADVANCED .7 DNX12 (GAUZE/BANDAGES/DRESSINGS) ×1 IMPLANT
DRAPE ARM DVNC X/XI (DISPOSABLE) ×3 IMPLANT
DRAPE COLUMN DVNC XI (DISPOSABLE) ×1 IMPLANT
DRAPE DA VINCI XI ARM (DISPOSABLE) ×3
DRAPE DA VINCI XI COLUMN (DISPOSABLE) ×1
ELECT CAUTERY BLADE 6.4 (BLADE) IMPLANT
ELECT REM PT RETURN 9FT ADLT (ELECTROSURGICAL) ×2
ELECTRODE REM PT RTRN 9FT ADLT (ELECTROSURGICAL) ×1 IMPLANT
GAUZE 4X4 16PLY ~~LOC~~+RFID DBL (SPONGE) ×2 IMPLANT
GLOVE SURG SYN 6.5 ES PF (GLOVE) ×4 IMPLANT
GLOVE SURG SYN 6.5 PF PI (GLOVE) ×2 IMPLANT
GLOVE SURG UNDER POLY LF SZ7 (GLOVE) ×4 IMPLANT
GOWN STRL REUS W/ TWL LRG LVL3 (GOWN DISPOSABLE) ×3 IMPLANT
GOWN STRL REUS W/TWL LRG LVL3 (GOWN DISPOSABLE) ×6
LABEL OR SOLS (LABEL) IMPLANT
MANIFOLD NEPTUNE II (INSTRUMENTS) ×2 IMPLANT
MESH 3DMAX 4X6 RT LRG (Mesh General) ×1 IMPLANT
MESH 3DMAX MID 4X6 RT LRG (Mesh General) IMPLANT
NDL INSUFFLATION 14GA 120MM (NEEDLE) ×1 IMPLANT
NEEDLE HYPO 22GX1.5 SAFETY (NEEDLE) ×2 IMPLANT
NEEDLE INSUFFLATION 14GA 120MM (NEEDLE) ×2 IMPLANT
OBTURATOR OPTICAL STANDARD 8MM (TROCAR) ×1
OBTURATOR OPTICAL STND 8 DVNC (TROCAR) ×1
OBTURATOR OPTICALSTD 8 DVNC (TROCAR) ×1 IMPLANT
PACK LAP CHOLECYSTECTOMY (MISCELLANEOUS) ×2 IMPLANT
PENCIL ELECTRO HAND CTR (MISCELLANEOUS) ×2 IMPLANT
SEAL CANN UNIV 5-8 DVNC XI (MISCELLANEOUS) ×3 IMPLANT
SEAL XI 5MM-8MM UNIVERSAL (MISCELLANEOUS) ×3
SET TUBE SMOKE EVAC HIGH FLOW (TUBING) ×2 IMPLANT
SOLUTION ELECTROLUBE (MISCELLANEOUS) ×2 IMPLANT
SUT MNCRL 4-0 (SUTURE) ×2
SUT MNCRL 4-0 27XMFL (SUTURE) ×1
SUT VIC AB 2-0 SH 27 (SUTURE) ×2
SUT VIC AB 2-0 SH 27XBRD (SUTURE) ×1 IMPLANT
SUT VLOC 90 6 CV-15 VIOLET (SUTURE) ×4 IMPLANT
SUTURE MNCRL 4-0 27XMF (SUTURE) ×1 IMPLANT
SYR 30ML LL (SYRINGE) ×2 IMPLANT
TAPE TRANSPORE STRL 2 31045 (GAUZE/BANDAGES/DRESSINGS) ×2 IMPLANT

## 2021-11-29 NOTE — Anesthesia Postprocedure Evaluation (Signed)
Anesthesia Post Note  Patient: Louis Blackwell  Procedure(s) Performed: XI ROBOTIC ASSISTED INGUINAL HERNIA (Right: Groin)  Patient location during evaluation: PACU Anesthesia Type: General Level of consciousness: awake and alert Pain management: pain level controlled Vital Signs Assessment: post-procedure vital signs reviewed and stable Respiratory status: spontaneous breathing, nonlabored ventilation, respiratory function stable and patient connected to nasal cannula oxygen Cardiovascular status: blood pressure returned to baseline and stable Postop Assessment: no apparent nausea or vomiting Anesthetic complications: no   No notable events documented.   Last Vitals:  Vitals:   11/29/21 1048 11/29/21 1100  BP: (!) 148/99 (!) 131/92  Pulse:  64  Resp:  19  Temp: (!) 36.2 C   SpO2: 97% 97%    Last Pain:  Vitals:   11/29/21 1115  TempSrc:   PainSc: Asleep                 Corinda Gubler

## 2021-11-29 NOTE — Discharge Instructions (Addendum)
Hernia repair, Care After This sheet gives you information about how to care for yourself after your procedure. Your health care provider may also give you more specific instructions. If you have problems or questions, contact your health care provider. What can I expect after the procedure? After your procedure, it is common to have the following: Pain in your abdomen, especially in the incision areas. You will be given medicine to control the pain. Tiredness. This is a normal part of the recovery process. Your energy level will return to normal over the next several weeks. Changes in your bowel movements, such as constipation or needing to go more often. Talk with your health care provider about how to manage this. Follow these instructions at home: Medicines  tylenol and advil as needed for discomfort.  Please alternate between the two every four hours as needed for pain.    Use narcotics, if prescribed, only when tylenol and motrin is not enough to control pain.  325-650mg every 8hrs to max of 3000mg/24hrs (including the 325mg in every norco dose) for the tylenol.    Advil up to 800mg per dose every 8hrs as needed for pain.   PLEASE RECORD NUMBER OF PILLS TAKEN UNTIL NEXT FOLLOW UP APPT.  THIS WILL HELP DETERMINE HOW READY YOU ARE TO BE RELEASED FROM ANY ACTIVITY RESTRICTIONS Do not drive or use heavy machinery while taking prescription pain medicine. Do not drink alcohol while taking prescription pain medicine.  Incision care    Follow instructions from your health care provider about how to take care of your incision areas. Make sure you: Keep your incisions clean and dry. Wash your hands with soap and water before and after applying medicine to the areas, and before and after changing your bandage (dressing). If soap and water are not available, use hand sanitizer. Change your dressing as told by your health care provider. Leave stitches (sutures), skin glue, or adhesive strips in  place. These skin closures may need to stay in place for 2 weeks or longer. If adhesive strip edges start to loosen and curl up, you may trim the loose edges. Do not remove adhesive strips completely unless your health care provider tells you to do that. Do not wear tight clothing over the incisions. Tight clothing may rub and irritate the incision areas, which may cause the incisions to open. Do not take baths, swim, or use a hot tub until your health care provider approves. OK TO SHOWER IN 24HRS.   Check your incision area every day for signs of infection. Check for: More redness, swelling, or pain. More fluid or blood. Warmth. Pus or a bad smell. Activity Avoid lifting anything that is heavier than 10 lb (4.5 kg) for 2 weeks or until your health care provider says it is okay. No pushing/pulling greater than 30lbs You may resume normal activities as told by your health care provider. Ask your health care provider what activities are safe for you. Take rest breaks during the day as needed. Eating and drinking Follow instructions from your health care provider about what you can eat after surgery. To prevent or treat constipation while you are taking prescription pain medicine, your health care provider may recommend that you: Drink enough fluid to keep your urine clear or pale yellow. Take over-the-counter or prescription medicines. Eat foods that are high in fiber, such as fresh fruits and vegetables, whole grains, and beans. Limit foods that are high in fat and processed sugars, such as fried and   sweet foods. General instructions Ask your health care provider when you will need an appointment to get your sutures or staples removed. Keep all follow-up visits as told by your health care provider. This is important. Contact a health care provider if: You have more redness, swelling, or pain around your incisions. You have more fluid or blood coming from the incisions. Your incisions feel  warm to the touch. You have pus or a bad smell coming from your incisions or your dressing. You have a fever. You have an incision that breaks open (edges not staying together) after sutures or staples have been removed. You develop a rash. You have chest pain or difficulty breathing. You have pain or swelling in your legs. You feel light-headed or you faint. Your abdomen swells (becomes distended). You have nausea or vomiting. You have blood in your stool (feces). This information is not intended to replace advice given to you by your health care provider. Make sure you discuss any questions you have with your health care provider. Document Released: 06/21/2005 Document Revised: 08/21/2018 Document Reviewed: 09/02/2016 Elsevier Interactive Patient Education  2019 [employer]   AMBULATORY SURGERY  DISCHARGE INSTRUCTIONS   The drugs that you were given will stay in your system until tomorrow so for the next 24 hours you should not:  Drive an automobile Make any legal decisions Drink any alcoholic beverage   You may resume regular meals tomorrow.  Today it is better to start with liquids and gradually work up to solid foods.  You may eat anything you prefer, but it is better to start with liquids, then soup and crackers, and gradually work up to solid foods.   Please notify your doctor immediately if you have any unusual bleeding, trouble breathing, redness and pain at the surgery site, drainage, fever, or pain not relieved by medication.    Additional Instructions:        Please contact your physician with any problems or Same Day Surgery at [phone], Monday through Friday 6 am to 4 pm, or [facility] [place] Main number at [phone].  

## 2021-11-29 NOTE — Anesthesia Preprocedure Evaluation (Signed)
Anesthesia Evaluation  Patient identified by MRN, date of birth, ID band Patient awake    Reviewed: Allergy & Precautions, NPO status , Patient's Chart, lab work & pertinent test results  History of Anesthesia Complications Negative for: history of anesthetic complications  Airway Mallampati: II  TM Distance: >3 FB Neck ROM: Full    Dental  (+) Poor Dentition, Chipped, Missing Very poor dentition. Patient says none loose:   Pulmonary neg sleep apnea, neg COPD, Current Smoker and Patient abstained from smoking.,    Pulmonary exam normal breath sounds clear to auscultation       Cardiovascular Exercise Tolerance: Good METS(-) hypertension(-) CAD and (-) Past MI negative cardio ROS  (-) dysrhythmias  Rhythm:Regular Rate:Normal - Systolic murmurs    Neuro/Psych  Headaches, negative psych ROS   GI/Hepatic GERD  ,(+)     substance abuse  alcohol use and marijuana use, Former drug abuser   Endo/Other  neg diabetes  Renal/GU negative Renal ROS     Musculoskeletal   Abdominal   Peds  Hematology   Anesthesia Other Findings Past Medical History: No date: GERD (gastroesophageal reflux disease)     Comment:  occ no meds No date: Headache     Comment:  migraines 03/2021: Shingles  Reproductive/Obstetrics                             Anesthesia Physical Anesthesia Plan  ASA: 2  Anesthesia Plan: General   Post-op Pain Management: Ketamine IV, Tylenol PO (pre-op), Gabapentin PO (pre-op) and Celebrex PO (pre-op)   Induction: Intravenous  PONV Risk Score and Plan: 3 and Ondansetron, Dexamethasone and Midazolam  Airway Management Planned: Oral ETT  Additional Equipment: None  Intra-op Plan:   Post-operative Plan: Extubation in OR  Informed Consent: I have reviewed the patients History and Physical, chart, labs and discussed the procedure including the risks, benefits and alternatives for  the proposed anesthesia with the patient or authorized representative who has indicated his/her understanding and acceptance.     Dental advisory given  Plan Discussed with: CRNA and Surgeon  Anesthesia Plan Comments: (Patient with very limited medical knowledge and understanding. Discussed risks of anesthesia with patient in plain layman terms, including PONV, sore throat, lip/dental/eye damage. Rare risks discussed as well, such as cardiorespiratory and neurological sequelae, and allergic reactions. Discussed the role of CRNA in patient's perioperative care. Patient appeared to understand.)        Anesthesia Quick Evaluation

## 2021-11-29 NOTE — Transfer of Care (Signed)
Immediate Anesthesia Transfer of Care Note  Patient: Louis Blackwell  Procedure(s) Performed: XI ROBOTIC ASSISTED INGUINAL HERNIA (Right: Groin)  Patient Location: PACU  Anesthesia Type:General  Level of Consciousness: drowsy  Airway & Oxygen Therapy: Patient Spontanous Breathing and Patient connected to nasal cannula oxygen  Post-op Assessment: Report given to RN and Post -op Vital signs reviewed and stable  Post vital signs: stable  Last Vitals:  Vitals Value Taken Time  BP 148/99 11/29/21 1050  Temp    Pulse 69 11/29/21 1052  Resp 19 11/29/21 1052  SpO2 97 % 11/29/21 1052  Vitals shown include unvalidated device data.  Last Pain:  Vitals:   11/29/21 0743  TempSrc: Oral         Complications: No notable events documented.

## 2021-11-29 NOTE — Interval H&P Note (Signed)
No change. OK to proceed.

## 2021-11-29 NOTE — Op Note (Signed)
Preoperative diagnosis: Right inguinal Hernia, reducible initial  Postoperative diagnosis: Right direct inguinal Hernia  Procedure: Robotic assisted laparoscopic right direct inguinal hernia repair with mesh  Anesthesia: General  Surgeon: Dr. Tonna Boehringer  Wound Classification: Clean  Specimen: none  Complications: None  Estimated Blood Loss: 62mL   Indications:  inguinal hernia. Repair was indicated to avoid complications of incarceration, obstruction and pain, and a prosthetic mesh repair was elected.  See H&P for further details.  Findings: 1. Vas Deferens and cord structures identified and preserved 2. Bard 3D max medium weight mesh used for repair 3. Adequate hemostasis achieved  Description of procedure: The patient was taken to the operating room. A time-out was completed verifying correct patient, procedure, site, positioning, and implant(s) and/or special equipment prior to beginning this procedure.  Area was prepped and draped in the usual sterile fashion. An incision was marked 20 cm above the pubic tubercle, slightly above the umbilicus  Scrotum wrapped in Kerlix roll.  Veress needle inserted at palmer's point.  Saline drop test noted to be positive with gradual increase in pressure after initiation of gas insufflation.  15 mm of pressure was achieved prior to removing the Veress needle and then placing a 8 mm port via the Optiview technique through the supraumbilical site.  Inspection of the area afterwards noted no injury to the surrounding organs during insertion of the needle and the port.  2 port sites were marked 8 cm to the lateral sides of the initial port, and a 8 mm robotic port was placed on the left side, another 8 mm robotic port on the right side under direct supervision.  Local anesthesia  infused to the preplanned incision sites prior to insertion of the port.  The [employer] platform was then brought into the operative field and docked to the ports.  Examination  of the abdominal cavity noted a right inguinal hernia.  A peritoneal flap was created approximately 8cm cephalad to the defect by using scissors with electrocautery.  Dissection was carried down towards the pubic tubercle, developing the myopectineal orifice view.  Laterally the flap was carried towards the ASIS.  Small hernia sac was noted, which carefully dissected away from the adjacent tissues to be fully reduced out of hernia cavity.  Any bleeding was controlled with combination of electrocautery and manual pressure.    After confirming adequate dissection and the peritoneal reflection completely down and away from the cord structures, a Large Bard 3DMax medium weight mesh was placed within the anterior abdominal wall, secured in place using 2-0 Vicryl on an SH needle immediately above the pubic tubercle.  After noting proper placement of the mesh with the peritoneal reflection deep to it, the previously created peritoneal flap was secured back up to the anterior abdominal wall using running 3-0 V-Lock.  Both needles were then removed out of the abdominal cavity, Xi platform undocked from the ports and removed off of operative field.  exparel infused as ilioinguinal block.  Abdomen then desufflated and ports removed. All the skin incisions were then closed with a subcuticular stitch of Monocryl 4-0. Dermabond was applied. The testis was gently pulled down into its anatomic position in the scrotum.  The patient tolerated the procedure well and was taken to the postanesthesia care unit in stable condition. Sponge and instrument count correct at end of procedure.

## 2021-11-29 NOTE — Anesthesia Procedure Notes (Signed)
Procedure Name: Intubation Date/Time: 11/29/2021 9:36 AM Performed by: Rodney Booze, CRNA Pre-anesthesia Checklist: Patient identified, Emergency Drugs available, Suction available and Patient being monitored Patient Re-evaluated:Patient Re-evaluated prior to induction Oxygen Delivery Method: Circle system utilized Preoxygenation: Pre-oxygenation with 100% oxygen Induction Type: IV induction Ventilation: Mask ventilation without difficulty Laryngoscope Size: Miller and 2 Grade View: Grade I Tube type: Oral Tube size: 7.0 mm Number of attempts: 1 Airway Equipment and Method: Stylet and Oral airway Placement Confirmation: ETT inserted through vocal cords under direct vision, positive ETCO2 and breath sounds checked- equal and bilateral Secured at: 21 cm Tube secured with: Tape Dental Injury: Teeth and Oropharynx as per pre-operative assessment

## 2021-12-19 ENCOUNTER — Emergency Department
Admission: EM | Admit: 2021-12-19 | Discharge: 2021-12-19 | Disposition: A | Discharge status: Home / Self Care | Payer: PRIVATE HEALTH INSURANCE | Attending: Emergency Medicine | Admitting: Emergency Medicine

## 2021-12-19 ENCOUNTER — Other Ambulatory Visit: Payer: Self-pay

## 2021-12-19 ENCOUNTER — Emergency Department: Payer: PRIVATE HEALTH INSURANCE

## 2021-12-19 ENCOUNTER — Encounter: Payer: Self-pay | Admitting: Emergency Medicine

## 2021-12-19 DIAGNOSIS — F17210 Nicotine dependence, cigarettes, uncomplicated: Secondary | ICD-10-CM | POA: Diagnosis not present

## 2021-12-19 DIAGNOSIS — R42 Dizziness and giddiness: Secondary | ICD-10-CM | POA: Diagnosis not present

## 2021-12-19 DIAGNOSIS — G8929 Other chronic pain: Secondary | ICD-10-CM | POA: Insufficient documentation

## 2021-12-19 DIAGNOSIS — R0789 Other chest pain: Secondary | ICD-10-CM | POA: Diagnosis present

## 2021-12-19 LAB — COMPREHENSIVE METABOLIC PANEL
ALT: 16 U/L (ref 0–44)
AST: 29 U/L (ref 15–41)
Albumin: 4.6 g/dL (ref 3.5–5.0)
Alkaline Phosphatase: 71 U/L (ref 38–126)
Anion gap: 10 (ref 5–15)
BUN: 11 mg/dL (ref 6–20)
CO2: 22 mmol/L (ref 22–32)
Calcium: 9.1 mg/dL (ref 8.9–10.3)
Chloride: 106 mmol/L (ref 98–111)
Creatinine, Ser: 0.74 mg/dL (ref 0.61–1.24)
GFR, Estimated: >60 mL/min (ref >60)
Glucose, Bld: 106 mg/dL — ABNORMAL HIGH (ref 70–99)
Potassium: 3.4 mmol/L — ABNORMAL LOW (ref 3.5–5.1)
Sodium: 138 mmol/L (ref 135–145)
Total Bilirubin: 0.6 mg/dL (ref 0.3–1.2)
Total Protein: 7.5 g/dL (ref 6.5–8.1)

## 2021-12-19 LAB — CBC
HCT: 39.8 % (ref 39.0–52.0)
Hemoglobin: 13.8 g/dL (ref 13.0–17.0)
MCH: 31.1 pg (ref 26.0–34.0)
MCHC: 34.7 g/dL (ref 30.0–36.0)
MCV: 89.6 fL (ref 80.0–100.0)
Platelets: 229 10*3/uL (ref 150–400)
RBC: 4.44 MIL/uL (ref 4.22–5.81)
RDW: 12.8 % (ref 11.5–15.5)
WBC: 12.6 10*3/uL — ABNORMAL HIGH (ref 4.0–10.5)
nRBC: 0 % (ref 0.0–0.2)

## 2021-12-19 LAB — TROPONIN I (HIGH SENSITIVITY): Troponin I (High Sensitivity): 7 ng/L (ref <18)

## 2021-12-19 MED ORDER — NAPROXEN 500 MG PO TABS: 500.0000 mg | ORAL_TABLET | Freq: Two times a day (BID) | ORAL | 0 refills | Status: DC

## 2021-12-19 NOTE — ED Provider Notes (Signed)
[facility] Provider Note    Event Date/Time   First MD Initiated Contact with Patient 12/19/21 [phone]     (approximate)   History   Chest Pain   HPI Louis Blackwell is a 31 y.o. male presents to the ED with complaint of anterior chest wall pain for 13 years that has not been evaluated until today.  Patient states he also has had some dizziness over the years that is never been addressed.  He states he fell at work twice due to dizziness but is unaware of any loss of consciousness or head injury.  Patient denies any injury or pain from his fall with the most recent fall during the past year.  Patient reports smoking 1/2 pack cigarettes per day and does drink alcohol on occasion.  He reports that he still has reflux disease.     Physical Exam   Triage Vital Signs: ED Triage Vitals  Enc Vitals Group     BP 12/19/21 0410 (!) 145/88     Pulse Rate 12/19/21 0410 88     Resp 12/19/21 0410 18     Temp 12/19/21 0410 98.2 F (36.8 C)     Temp Source 12/19/21 0410 Oral     SpO2 12/19/21 0410 100 %     Weight 12/19/21 0411 159 lb (72.1 kg)     Height 12/19/21 0411 5\' 4"  (1.626 m)     Head Circumference --      Peak Flow --      Pain Score --      Pain Loc --      Pain Edu? --      Excl. in [place]? --     Most recent vital signs: Vitals:   12/19/21 1130 12/19/21 1200  BP: (!) 130/97 (!) 132/93  Pulse: 73 72  Resp:    Temp:    SpO2:       General: Awake, no distress.  CV:  Good peripheral perfusion.  Heart regular rate and rhythm without murmur. Resp:  Normal effort.  Lungs are clear bilaterally.  Left lateral-anterior ribs approximately 7, 8 and ninth area is where patient is experiencing pain but no deformity, soft tissue edema or discoloration is noted in this area. Abd:  No distention.  Soft, nontender and bowel sounds are normoactive x4 quadrants. Other:  Pupils equal round reactive to light, sclera clear, cranial nerves II through XII grossly intact.   No cervical or thoracic tenderness is noted.  Good muscle strength bilaterally.  Patient is able to ambulate    ED Results / Procedures / Treatments   Labs (all labs ordered are listed, but only abnormal results are displayed) Labs Reviewed  CBC - Abnormal; Notable for the following components:      Result Value   WBC 12.6 (*)    All other components within normal limits  COMPREHENSIVE METABOLIC PANEL - Abnormal; Notable for the following components:   Potassium 3.4 (*)    Glucose, Bld 106 (*)    All other components within normal limits  TROPONIN I (HIGH SENSITIVITY)     EKG Vent. rate 79 BPM PR interval 122 ms QRS duration 90 ms QT/QTcB 346/396 ms P-R-T axes 17 64 67 Normal sinus rhythm with sinus arrhythmia Normal ECG When compared with ECG of 12-Nov-2017 23:17, No significant change was found EKG was reviewed by myself and seen by physician in the ED.   RADIOLOGY Chest x-ray was reviewed by me and radiology report was  reviewed with report showing no acute changes or acute disease. CT scan of the head was negative for intracranial changes per radiologist and reviewed by myself.  PROCEDURES:  Critical Care performed: No  Procedures   MEDICATIONS ORDERED IN ED: Medications - No data to display   IMPRESSION / MDM / ASSESSMENT AND PLAN / ED COURSE  I reviewed the triage vital signs and the nursing notes.                              Differential diagnosis includes, but is not limited to, atypical chest pain, chest wall pain, fractured ribs, intercranial changes such as subdural due to falling.  Also brain tumors, brain lesions, hypoglycemia, dehydration.  31 year old male presents to the ED with history of left anterior lateral chest wall pain for 13 years.  Patient states that he has been seen by his PCP several times for "physicals".  At these physicals he says that he mentions this but is not been explored as to why he has pain for 13 years.  Patient  currently smokes 1/2 pack cigarettes a day and also admits to occasional alcohol use.  Patient has had a history of reflux in the past but states he is not on any medication.  Chest x-ray was negative for any acute changes.  CT scan of the head was negative for acute intracranial changes.  Lab work was unremarkable.  Patient was made aware that his rib pain for 13 years is difficult to evaluate as this is a chronic issue and should be seen by his PCP.  He is also reassured that his dizziness is not from any type of intracranial changes such as brain tumor or bleed.  Patient denies any recent problems with reflux.  A prescription for naproxen 5 mg twice daily with food was sent to his pharmacy for him to take only if he is having discomfort in his rib.  Patient is strongly encouraged to follow-up with his PCP and call today to make an appointment.      FINAL CLINICAL IMPRESSION(S) / ED DIAGNOSES   Final diagnoses:  Chest wall pain  Chronic chest wall pain     Rx / DC Orders   ED Discharge Orders          Ordered    naproxen (NAPROSYN) 500 MG tablet  2 times daily with meals        12/19/21 1144             Note:  This document was prepared using Dragon voice recognition software and may include unintentional dictation errors.   Tommi Rumps, PA-C 12/19/21 1210    Jene Every, MD 12/19/21 1213

## 2021-12-19 NOTE — Discharge Instructions (Signed)
Call today to make an appoint with your primary care provider.  Let them know that you were seen in the emergency department and you need to be reevaluated in the next 3 to 4 weeks.  Naproxen with food twice a day if needed for rib pain.  It is important that you eat 3 meals a day and drink lots of water to stay hydrated.  Occasionally your blood sugar can drop from not eating and also if you are mildly dehydrated from not drinking enough fluids this can cause dizziness as well.

## 2021-12-19 NOTE — ED Triage Notes (Signed)
Pt in with co chest pain x 13 years and tonight he became dizzy. States fell x 2 at work due to dizziness. Denies any pain from the fall.

## 2022-05-23 ENCOUNTER — Other Ambulatory Visit: Payer: Self-pay

## 2022-05-23 ENCOUNTER — Emergency Department
Admission: EM | Admit: 2022-05-23 | Discharge: 2022-05-23 | Disposition: A | Discharge status: Home / Self Care | Payer: PRIVATE HEALTH INSURANCE | Attending: Emergency Medicine | Admitting: Emergency Medicine

## 2022-05-23 ENCOUNTER — Emergency Department: Payer: PRIVATE HEALTH INSURANCE

## 2022-05-23 DIAGNOSIS — M25512 Pain in left shoulder: Secondary | ICD-10-CM | POA: Diagnosis not present

## 2022-05-23 DIAGNOSIS — M25511 Pain in right shoulder: Secondary | ICD-10-CM | POA: Diagnosis not present

## 2022-05-23 DIAGNOSIS — R200 Anesthesia of skin: Secondary | ICD-10-CM | POA: Insufficient documentation

## 2022-05-23 MED ORDER — CYCLOBENZAPRINE HCL 10 MG PO TABS
10.0000 mg | ORAL_TABLET | Freq: Three times a day (TID) | ORAL | 0 refills | Status: AC | PRN
Start: 2022-05-23 — End: 2022-05-28

## 2022-05-23 MED ORDER — NAPROXEN 500 MG PO TABS
500.0000 mg | ORAL_TABLET | Freq: Two times a day (BID) | ORAL | 11 refills | Status: AC
Start: 2022-05-23 — End: 2023-05-23

## 2022-05-23 NOTE — ED Notes (Signed)
D/C, new RX, reasons to return discussed with pt, pt verbalized understanding. NAD noted. Pt ambulatory with steady gait on D/C.

## 2022-05-23 NOTE — ED Triage Notes (Addendum)
Patient to ER via POV. Patient reports that he is unable to lift either of his arms above his head, states that when he is able, he can't hold them above his head for an extended time. Denies injury. Denies hx of the same. States he first noticed this a week ago.

## 2022-05-23 NOTE — ED Provider Notes (Signed)
[facility] Provider Note    Event Date/Time   First MD Initiated Contact with Patient 05/23/22 1136     (approximate)   History   Chief Complaint Extremity Weakness   HPI Louis Blackwell is a 31 y.o. male, history of GERD, presents to the emergency department for evaluation of bilateral shoulder pain.  He states that his right shoulder has been bothering him for several months, however his left shoulder started bothering him over the past couple weeks.  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  He states that he is unable to lift his arms over his head without significant pain.  Endorses some numbness/tingling intermittently into his first/second/third digits in the left upper extremity.  Additionally states that he has frequent clicking and cracking in his shoulders often.  Denies fever/chills, chest pain, shortness of breath, abdominal pain, flank pain, nausea/vomiting, dysuria, vision changes, hearing changes, dizziness/lightheadedness, or recent injuries or illnesses.  History Limitations: No limitations.        Physical Exam  Triage Vital Signs: ED Triage Vitals [05/23/22 1114]  Enc Vitals Group     BP (!) 144/98     Pulse Rate 66     Resp 17     Temp 98 F (36.7 C)     Temp Source Oral     SpO2 96 %     Weight      Height 5\' 5"  (1.651 m)     Head Circumference      Peak Flow      Pain Score 0     Pain Loc      Pain Edu?      Excl. in [place]?     Most recent vital signs: Vitals:   05/23/22 1114  BP: (!) 144/98  Pulse: 66  Resp: 17  Temp: 98 F (36.7 C)  SpO2: 96%    General: Awake, NAD.  Skin: Warm, dry. No rashes or lesions.  Eyes: PERRL. Conjunctivae normal.  CV: Good peripheral perfusion.  Resp: Normal effort.  Abd: Soft, non-tender. No distention.  Neuro: At baseline. No gross neurological deficits.   Focused Exam: No gross deformities to the upper extremities.  Patient is  able to lift his arms up, but stops at roughly 90 degrees due to the pain.  Additional pain elicited with passive abduction as well.  Negative empty can test.  Negative sulcus sign.  Pulse, motor, sensation intact distally in both upper extremities.  Normal grip strength bilaterally.  No bony tenderness.   Physical Exam    ED Results / Procedures / Treatments  Labs (all labs ordered are listed, but only abnormal results are displayed) Labs Reviewed - No data to display   EKG N/A.   RADIOLOGY  ED Provider Interpretation: I personally viewed and interpreted these plain films.  No evidence of fractures or dislocations.  DG Shoulder Left  Result Date: 05/23/2022 CLINICAL DATA:  Shoulder pain, decreased range of motion EXAM: LEFT SHOULDER - 2+ VIEW COMPARISON:  05/23/2022, 12/19/2021 FINDINGS: There is no evidence of fracture or dislocation. There is no evidence of arthropathy or other focal bone abnormality. Soft tissues are unremarkable. IMPRESSION: Negative. Electronically Signed   By: Jerilynn Mages.  Shick M.D.   On: 05/23/2022 12:47   DG Shoulder Right  Result Date: 05/23/2022 CLINICAL DATA:  Shoulder pain, decreased range of motion EXAM: RIGHT SHOULDER - 2+ VIEW COMPARISON:  12/19/2021 FINDINGS: There is no evidence  of fracture or dislocation. There is no evidence of arthropathy or other focal bone abnormality. Soft tissues are unremarkable. IMPRESSION: Negative. Electronically Signed   By: Jerilynn Mages.  Shick M.D.   On: 05/23/2022 12:46    PROCEDURES:  Critical Care performed: N/A.  Procedures    MEDICATIONS ORDERED IN ED: Medications - No data to display   IMPRESSION / MDM / [place] / ED COURSE  I reviewed the triage vital signs and the nursing notes.                              Differential diagnosis includes, but is not limited to, bursitis, rotator cuff injury, biceps tendinitis, adhesive capsulitis, shoulder strain, osteoarthritis, labrum tear  ED Course Patient appears  well, vitals within normal limits.  NAD.  No pain at rest.  Assessment/Plan Patient presents with bilateral shoulder pain.  Appears to be long-term in the right shoulder versus developing in the left shoulder.  Physical exam suggestive of bursitis versus labral tear.  X-ray does not show any evidence of fractures or dislocations.  Patient otherwise appears clinically well.  The numbness in his left upper extremity may be related to a cervical radiculopathy, though he states that it comes and goes.  He has normal grip strength.  We will provide him with prescription for naproxen and cyclobenzaprine.  We will additionally provide him with a referral to orthopedics.  We will plan to discharge.  Patient's presentation is most consistent with acute complicated illness / injury requiring diagnostic workup.   Provided the patient with anticipatory guidance, return precautions, and educational material. Encouraged the patient to return to the emergency department at any time if they begin to experience any new or worsening symptoms. Patient expressed understanding and agreed with the plan.       FINAL CLINICAL IMPRESSION(S) / ED DIAGNOSES   Final diagnoses:  Bilateral shoulder pain, unspecified chronicity     Rx / DC Orders   ED Discharge Orders          Ordered    naproxen (NAPROSYN) 500 MG tablet  2 times daily with meals        05/23/22 1333    cyclobenzaprine (FLEXERIL) 10 MG tablet  3 times daily PRN        05/23/22 1335             Note:  This document was prepared using Dragon voice recognition software and may include unintentional dictation errors.   Teodoro Spray, [place] 05/23/22 1342    Vanessa [place], MD 05/23/22 1359

## 2022-05-23 NOTE — Discharge Instructions (Addendum)
-  He may take Tylenol and naproxen as needed for pain.  You may take cyclobenzaprine as well, though use caution as it may make you drowsy.  -Follow-up with the orthopedist listed in these instructions for further evaluation and management.  -Return to the emergency department anytime if you begin to experience any new or worsening symptoms

## 2022-06-07 ENCOUNTER — Other Ambulatory Visit: Payer: Self-pay | Admitting: Primary Care

## 2022-07-02 IMAGING — CT CT HEAD W/O CM
4 series · 17 of 47 positions shown, 19 images · non-contrast
Comparison: None available

CLINICAL DATA: Dizziness, nonspecific. Fell at work due to
dizziness. No pain.

EXAM:
CT HEAD WITHOUT CONTRAST
TECHNIQUE: Contiguous axial images were obtained from the base of the skull
through the vertex without intravenous contrast.

[Series 2: head wo · axial · 0.40mm/px · z∈[-67,+38]mm · 7 of 29 slices shown, 9 images]
[im 4/29  brain]
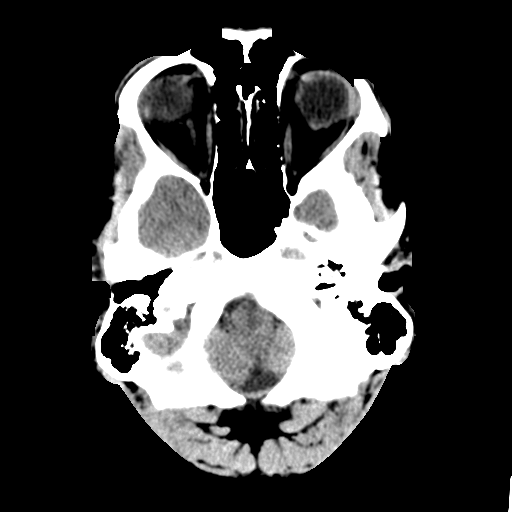
[im 4/29  bone]
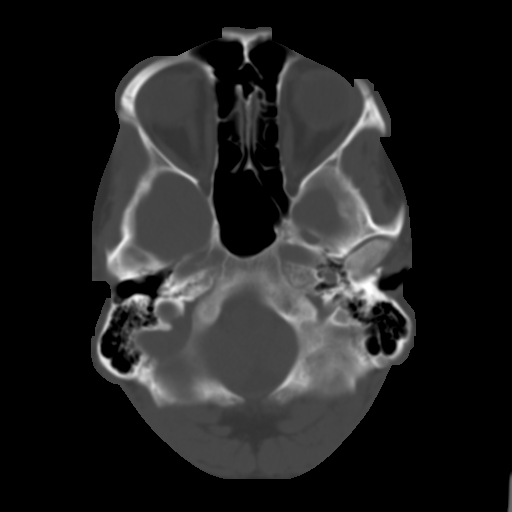
[im 8/29  brain]
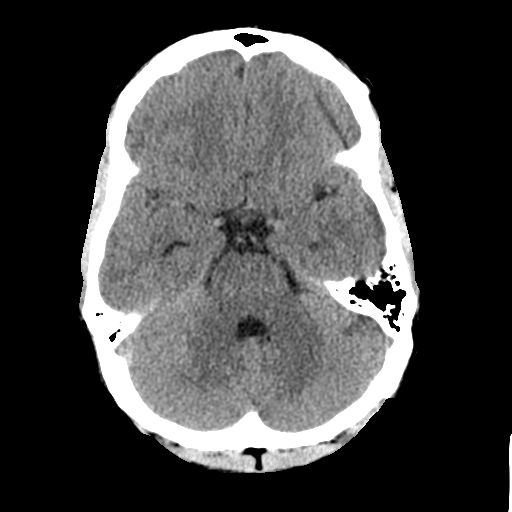
[im 11/29  brain]
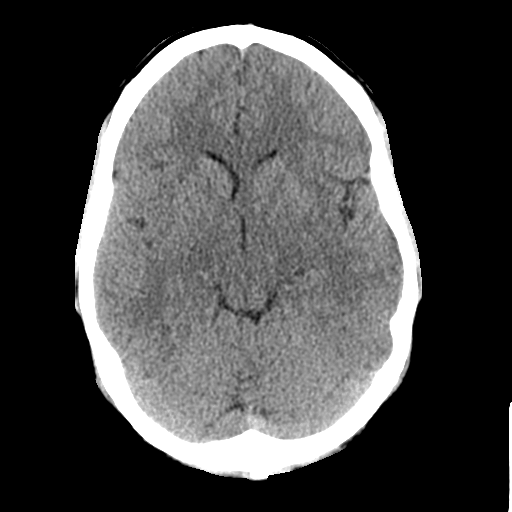
[im 15/29  brain]
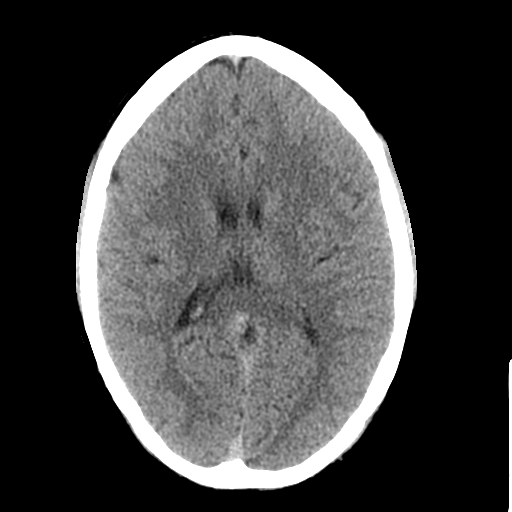
[im 18/29  brain]
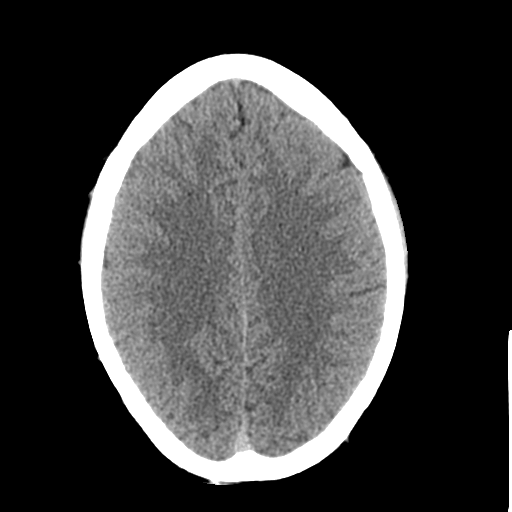
[im 18/29  bone]
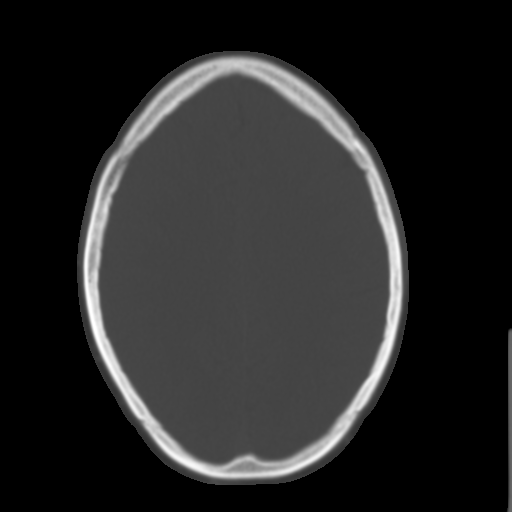
[im 22/29  brain]
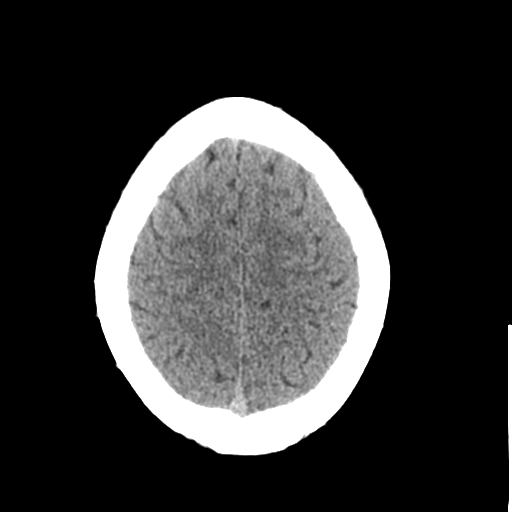
[im 25/29  brain]
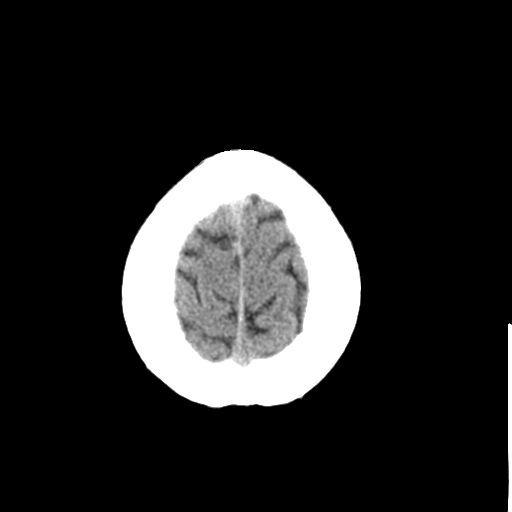

[Series 3: head bone · axial · 0.40mm/px · z∈[-68,-20]mm · 4 of 72 slices shown]
[im 8/72  bone]
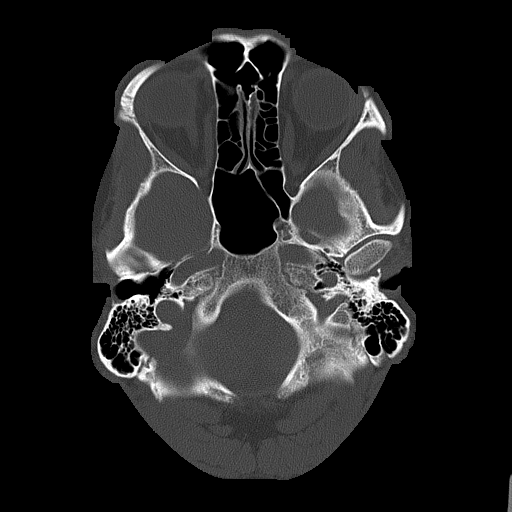
[im 15/72  bone]
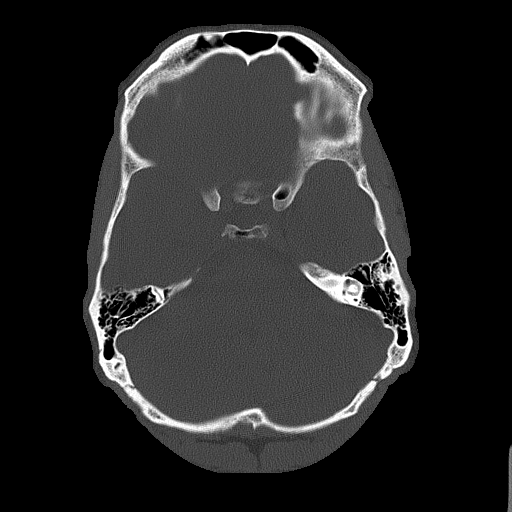
[im 22/72  bone]
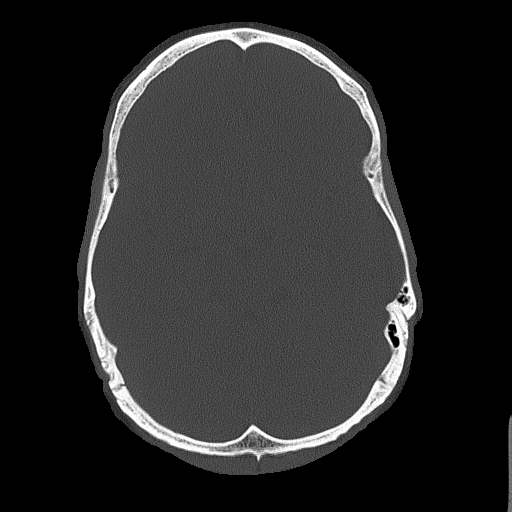
[im 32/72  bone]
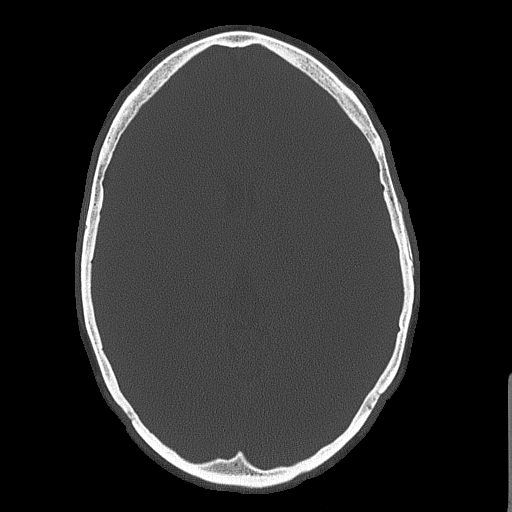

[Series 4: coronal soft tissue · coronal · 0.31mm/px · 3 of 67 slices shown]
[im 23/67  brain]
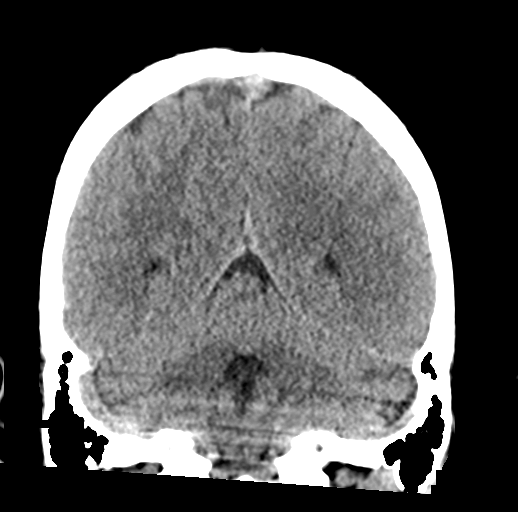
[im 30/67  brain]
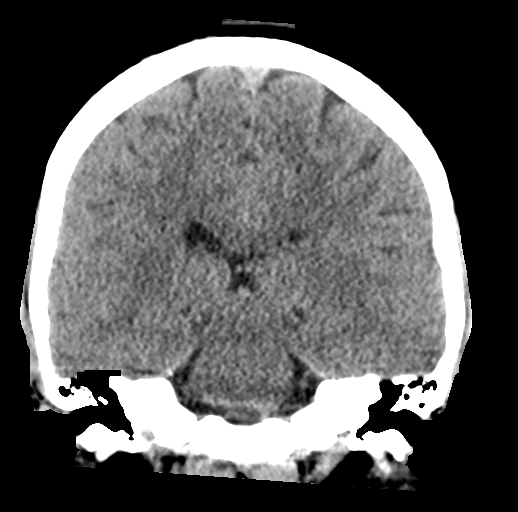
[im 37/67  brain]
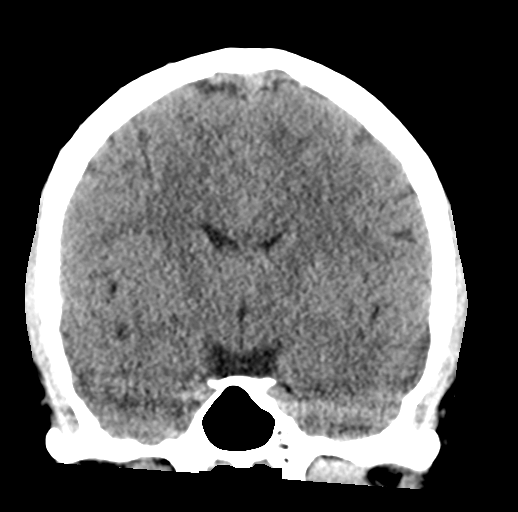

[Series 5: sagittal soft tissue · sagittal · 0.31mm/px · 3 of 55 slices shown]
[im 19/55  brain]
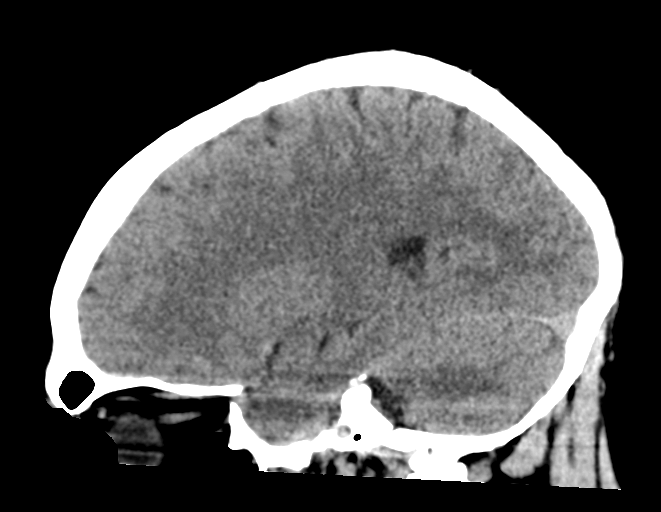
[im 28/55  brain]
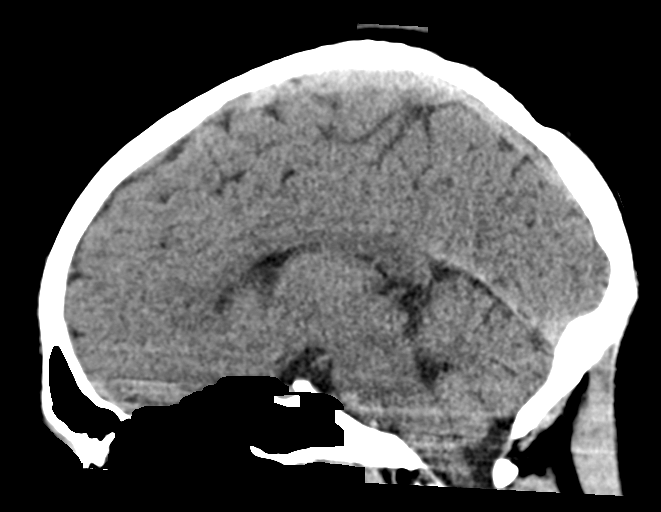
[im 37/55  brain]
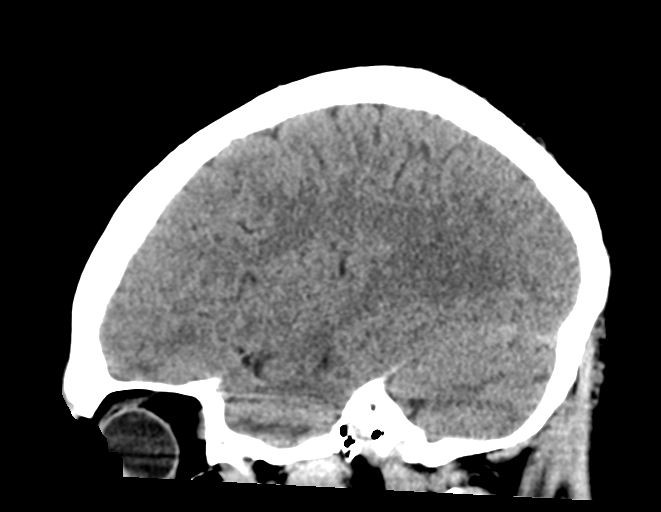

[17 of 47 positions shown; findings below may reference images not displayed]

FINDINGS: Brain: The ventricles are normal in size and configuration. The
basilar cisterns are patent.

No mass, mass effect, or midline shift.

No acute intracranial hemorrhage is seen.

No abnormal extra-axial fluid collection.

Preservation of the normal cortical gray-white interface without CT
evidence of an acute major vascular territorial cortical based
infarction.

Vascular: No hyperdense vessel or unexpected calcification.

Skull: Normal. Negative for fracture or focal lesion.

Sinuses/Orbits: No acute finding. The mastoid air cells are clear.

Other: None.
IMPRESSION: No acute intracranial process.

## 2022-12-04 IMAGING — CR DG SHOULDER 2+V*R*
1 series · 3 of 3 positions shown · non-contrast
Comparison: 12/19/2021

CLINICAL DATA: Shoulder pain, decreased range of motion

EXAM:
RIGHT SHOULDER - 2+ VIEW

[Series 1: dg shoulder right · 0.14mm/px · 3 of 3 slices shown]
[im 1/3]
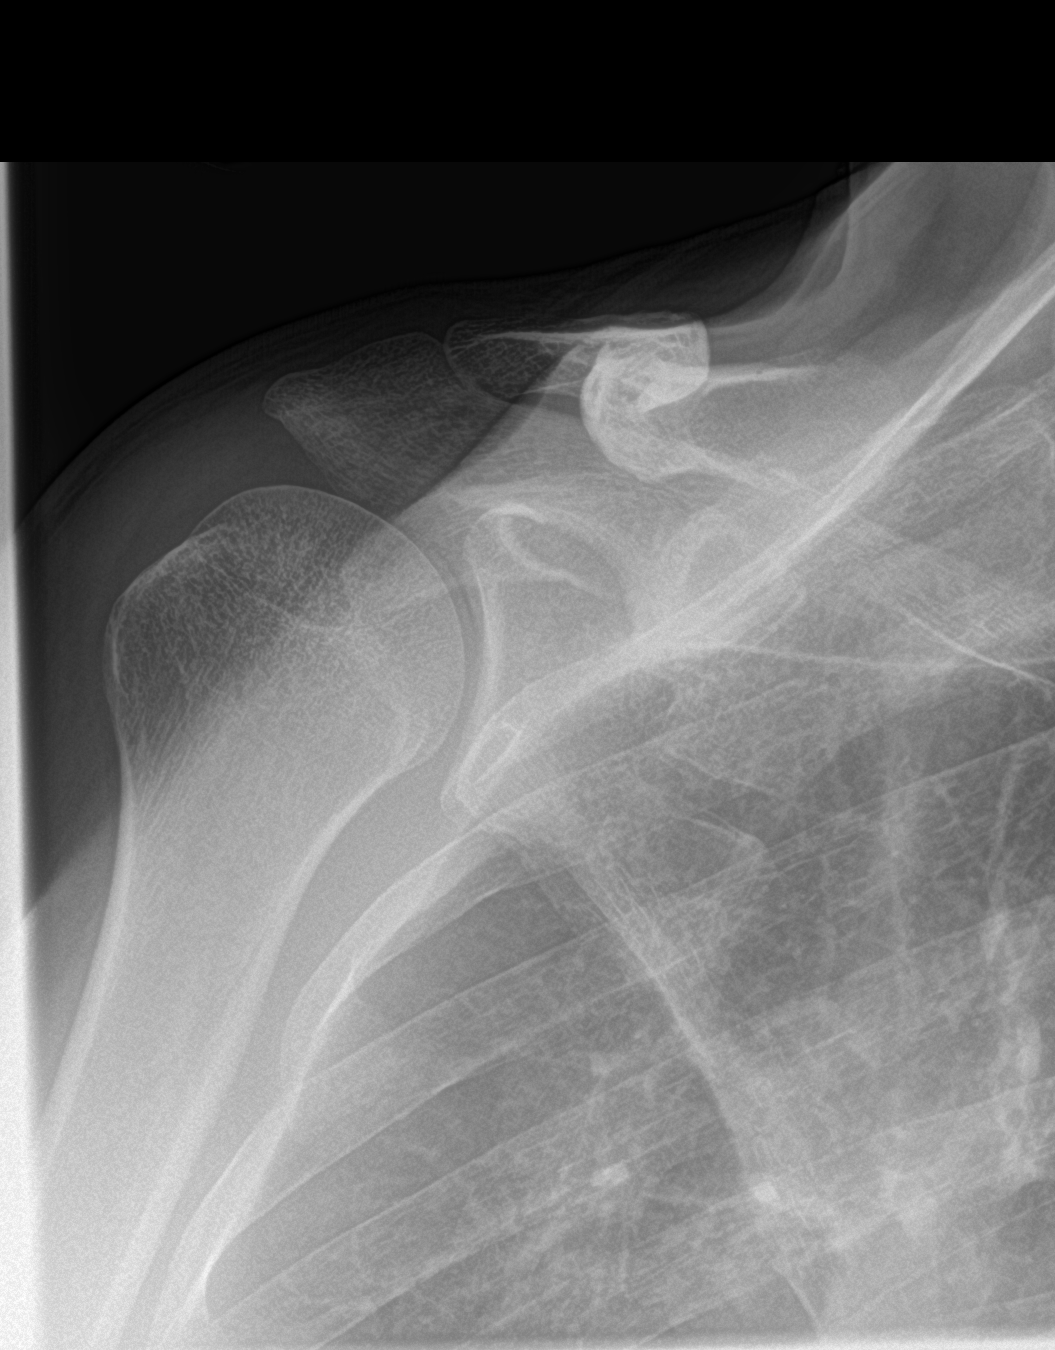
[im 2/3]
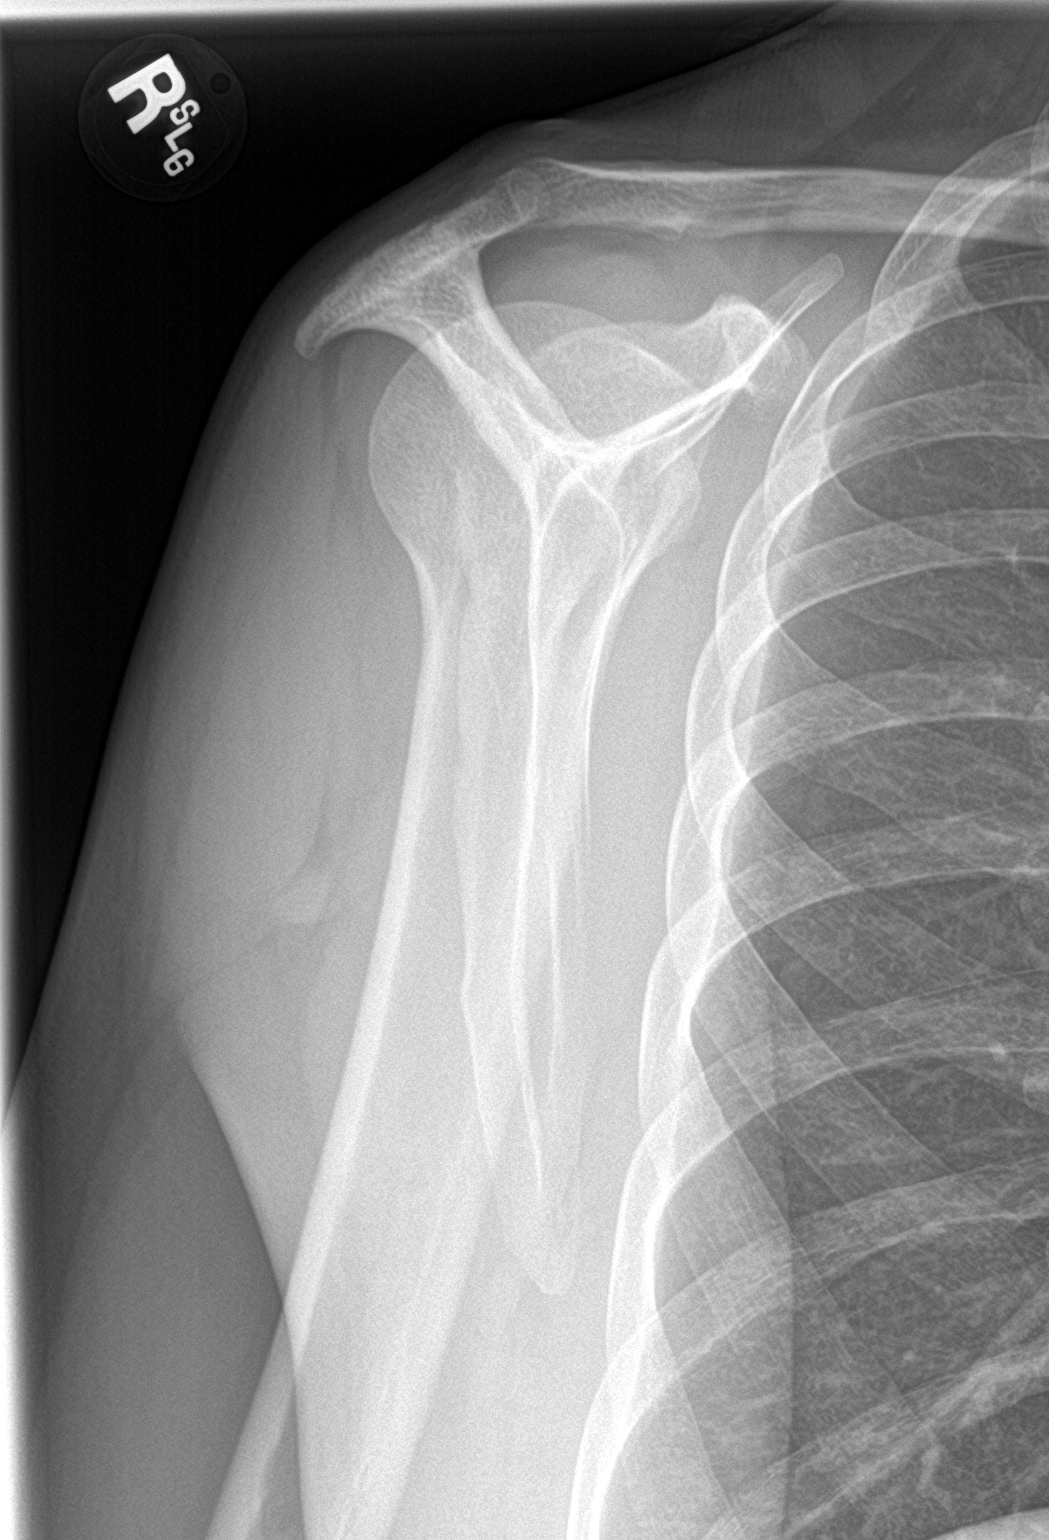
[im 3/3]
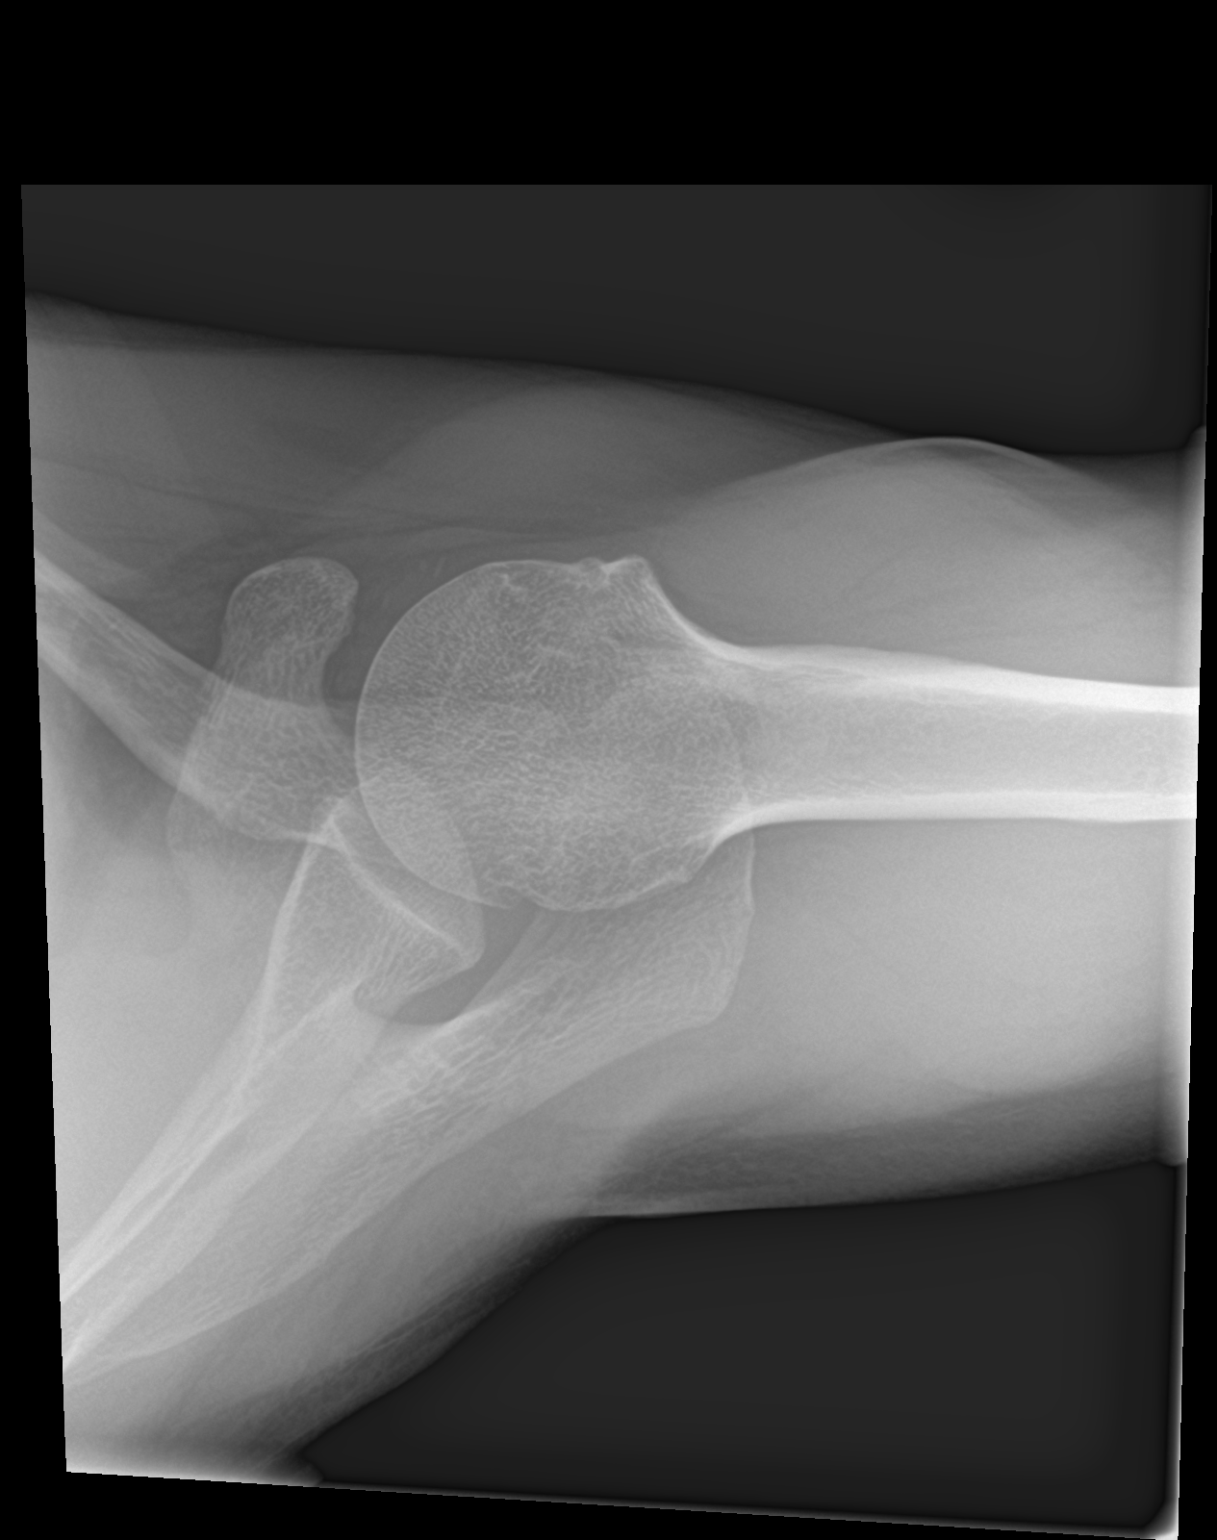

[3 of 3 positions shown; findings below may reference images not displayed]

FINDINGS: There is no evidence of fracture or dislocation. There is no
evidence of arthropathy or other focal bone abnormality. Soft
tissues are unremarkable.
IMPRESSION: Negative.

## 2022-12-04 IMAGING — CR DG SHOULDER 2+V*L*
1 series · 3 of 3 positions shown · non-contrast
Comparison: 05/23/2022, 12/19/2021

CLINICAL DATA: Shoulder pain, decreased range of motion

EXAM:
LEFT SHOULDER - 2+ VIEW

[Series 1: dg shoulder left · 0.14mm/px · 3 of 3 slices shown]
[im 1/3]
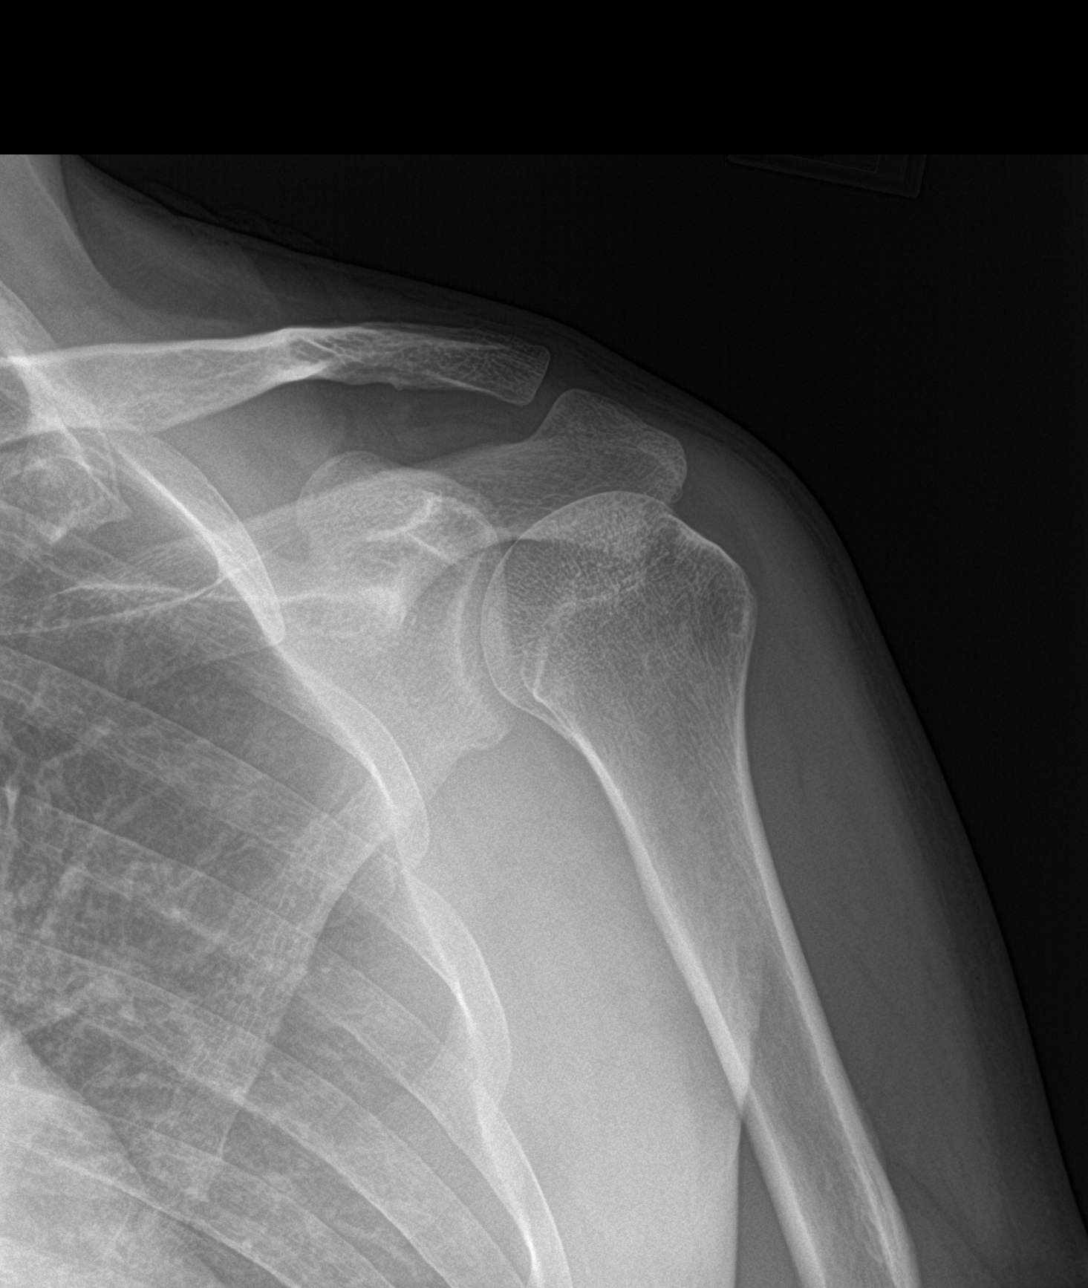
[im 2/3]
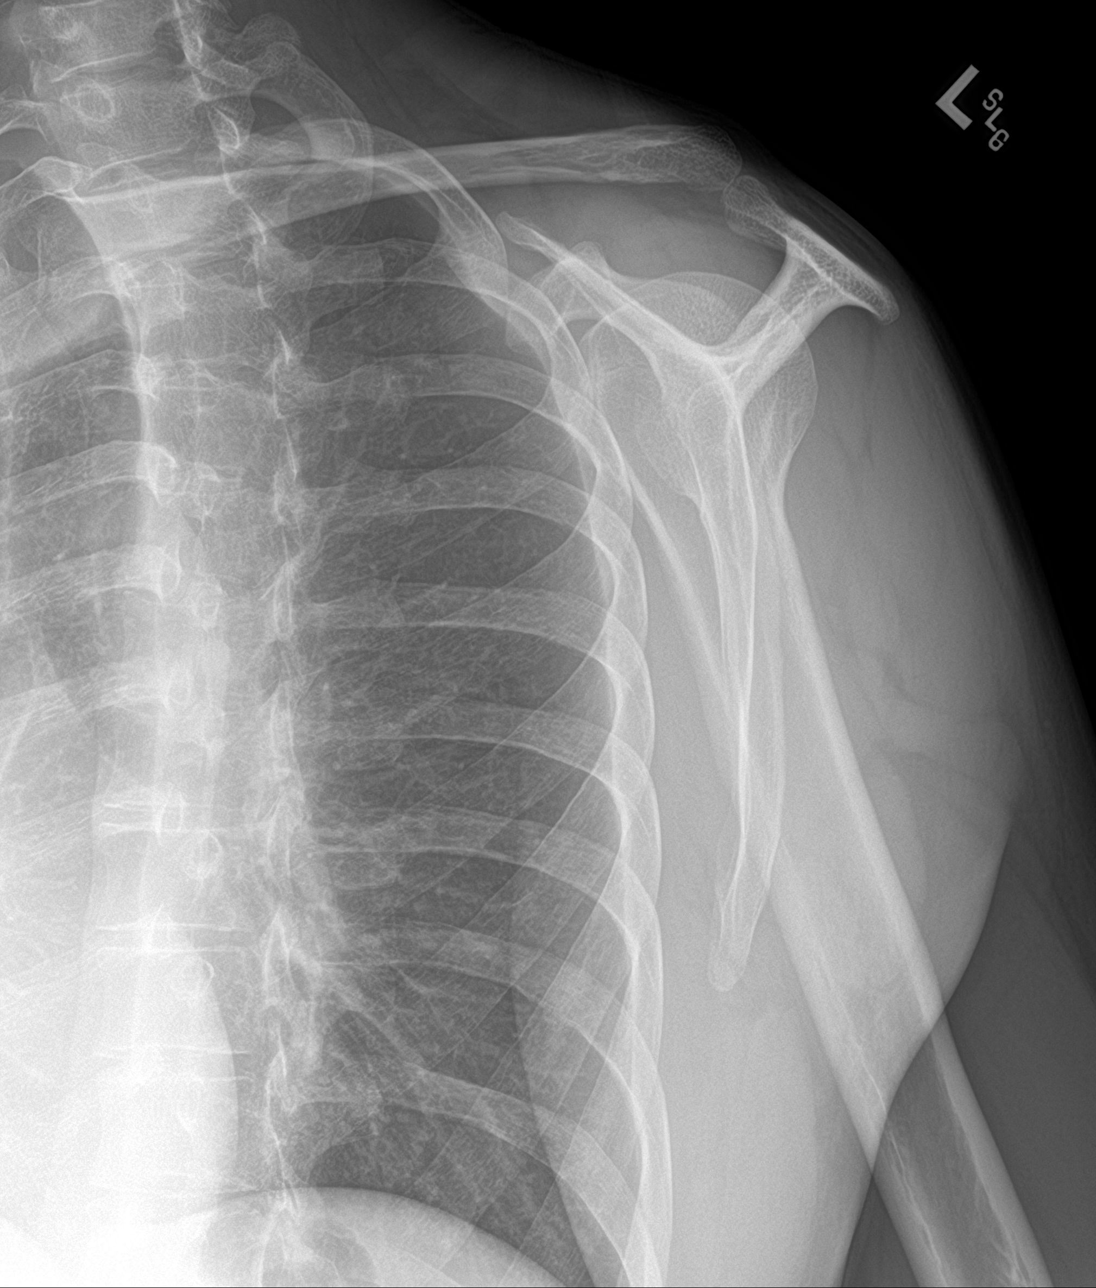
[im 3/3]
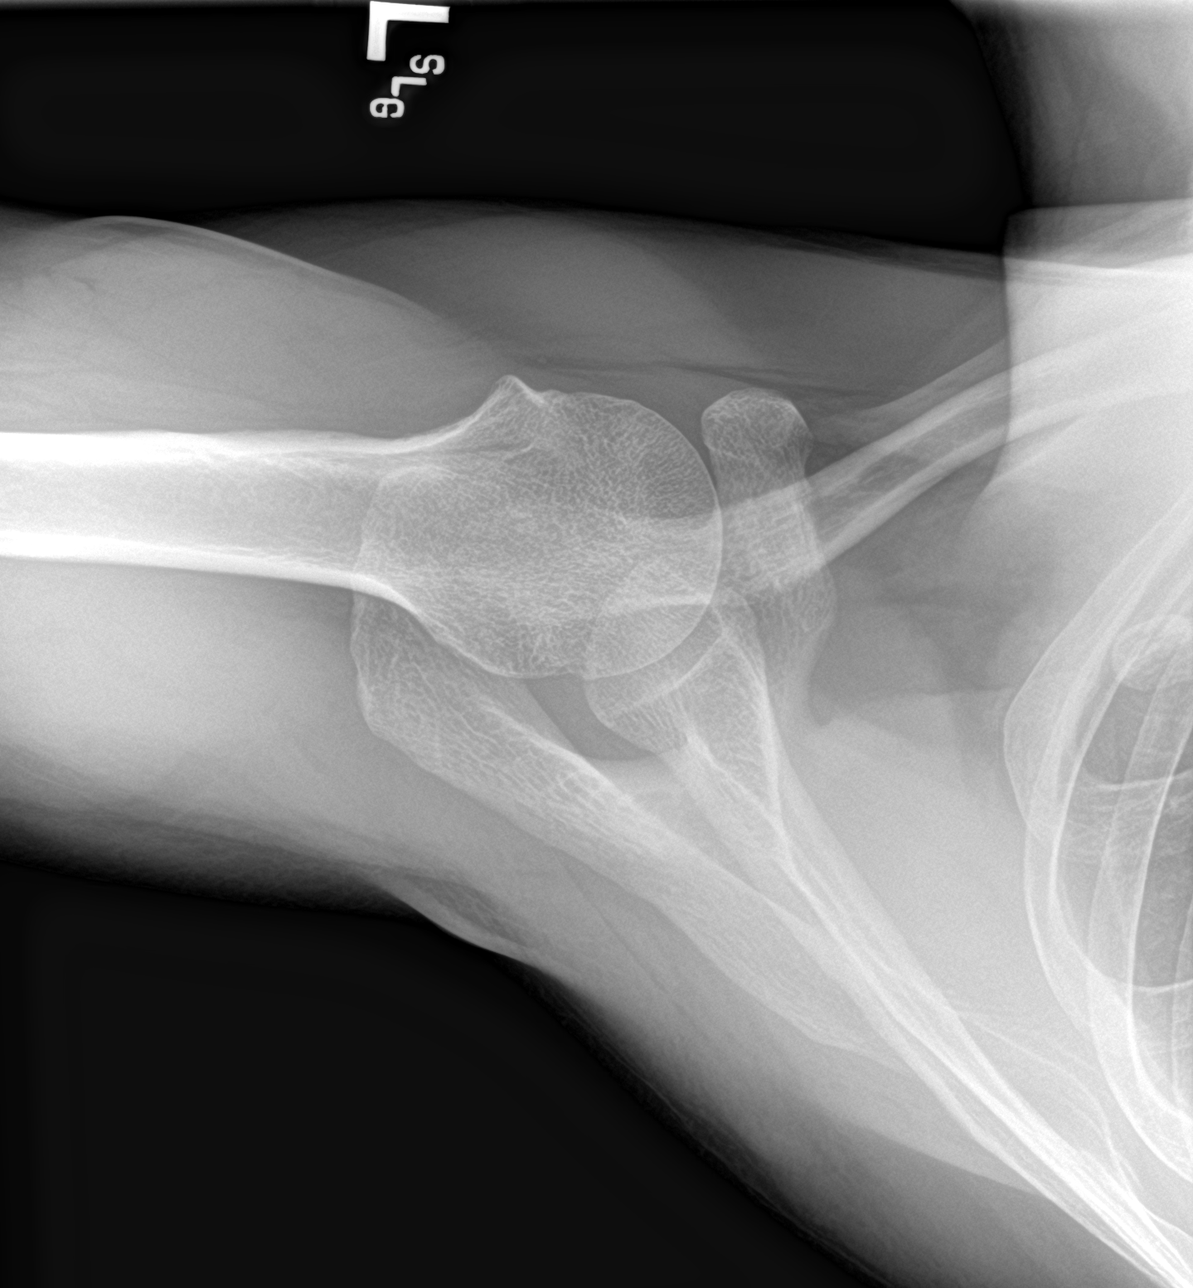

[3 of 3 positions shown; findings below may reference images not displayed]

FINDINGS: There is no evidence of fracture or dislocation. There is no
evidence of arthropathy or other focal bone abnormality. Soft
tissues are unremarkable.
IMPRESSION: Negative.

## 2024-08-25 ENCOUNTER — Emergency Department
Admission: EM | Admit: 2024-08-25 | Discharge: 2024-08-25 | Disposition: A | Discharge status: Home / Self Care | Payer: Self-pay | Attending: Emergency Medicine | Admitting: Emergency Medicine

## 2024-08-25 ENCOUNTER — Encounter: Payer: Self-pay | Admitting: *Deleted

## 2024-08-25 ENCOUNTER — Emergency Department: Payer: Self-pay

## 2024-08-25 ENCOUNTER — Other Ambulatory Visit: Payer: Self-pay

## 2024-08-25 DIAGNOSIS — S62339A Displaced fracture of neck of unspecified metacarpal bone, initial encounter for closed fracture: Secondary | ICD-10-CM

## 2024-08-25 DIAGNOSIS — S62336A Displaced fracture of neck of fifth metacarpal bone, right hand, initial encounter for closed fracture: Secondary | ICD-10-CM | POA: Insufficient documentation

## 2024-08-25 DIAGNOSIS — W228XXA Striking against or struck by other objects, initial encounter: Secondary | ICD-10-CM | POA: Insufficient documentation

## 2024-08-25 MED ORDER — LIDOCAINE HCL (PF) 1 % IJ SOLN
10.0000 mL | Freq: Once | INTRAMUSCULAR | Status: AC
  Administered 2024-08-25: 10 mL via INTRADERMAL
  Filled 2024-08-25: qty 10

## 2024-08-25 NOTE — ED Provider Notes (Signed)
 [facility] Provider Note    Event Date/Time   First MD Initiated Contact with Patient 08/25/24 2135     (approximate)   History   Hand Injury   HPI  Louis Blackwell is a 33 y.o. male state of health tonight when he punched a wall.  Had immediate pain in the right hand.  No other injuries.     Physical Exam   Triage Vital Signs: ED Triage Vitals  Encounter Vitals Group     BP 08/25/24 2028 (!) 150/103     Girls Systolic BP Percentile --      Girls Diastolic BP Percentile --      Boys Systolic BP Percentile --      Boys Diastolic BP Percentile --      Pulse Rate 08/25/24 2028 84     Resp 08/25/24 2028 18     Temp 08/25/24 2028 98.7 F (37.1 C)     Temp Source 08/25/24 2028 Oral     SpO2 08/25/24 2028 100 %     Weight 08/25/24 2026 158 lb 11.7 oz (72 kg)     Height 08/25/24 2026 5' 5 (1.651 m)     Head Circumference --      Peak Flow --      Pain Score 08/25/24 2026 10     Pain Loc --      Pain Education --      Exclude from Growth Chart --     Most recent vital signs: Vitals:   08/25/24 2028  BP: (!) 150/103  Pulse: 84  Resp: 18  Temp: 98.7 F (37.1 C)  SpO2: 100%    General: Awake, no distress.  CV:  Good peripheral perfusion.  Normal distal perfusion, normal radial pulse in the right Resp:  Normal effort.  Abd:  No distention.  Other:  Swelling and deformity over the fifth metacarpal of the right hand.  No laceration.   ED Results / Procedures / Treatments   Labs (all labs ordered are listed, but only abnormal results are displayed) Labs Reviewed - No data to display   RADIOLOGY Right hand x-ray interpreted by me, shows volar angulation of a distal fifth metacarpal fracture   PROCEDURES:  .Ortho Injury Treatment  Date/Time: 08/25/2024 11:00 PM  Performed by: Viviann Pastor, MD Authorized by: Viviann Pastor, MD   Consent:    Consent obtained:  Verbal   Consent given by:  Patient   Risks discussed:   Nerve damage, restricted joint movement and stiffness   Alternatives discussed:  ImmobilizationInjury location: hand Location details: right hand Injury type: fracture Fracture type: fifth metacarpal Pre-procedure neurovascular assessment: neurovascularly intact Pre-procedure distal perfusion: normal Pre-procedure neurological function: normal Pre-procedure range of motion: reduced Anesthesia: hematoma block  Anesthesia: Local anesthesia used: yes Local Anesthetic: lidocaine  1% without epinephrine  Anesthetic total: 7 mL  Patient sedated: NoManipulation performed: yes Reduction successful: yes X-ray confirmed reduction: yes Immobilization: splint Splint type: ulnar gutter Splint Applied by: ED Provider Supplies used: cotton padding, elastic bandage and Ortho-Glass Post-procedure neurovascular assessment: post-procedure neurovascularly intact Post-procedure distal perfusion: normal Post-procedure neurological function: normal Post-procedure range of motion: normal      MEDICATIONS ORDERED IN ED: Medications  lidocaine  (PF) (XYLOCAINE ) 1 % injection 10 mL (10 mLs Intradermal Given 08/25/24 2238)     IMPRESSION / MDM / ASSESSMENT AND PLAN / ED COURSE  I reviewed the triage vital signs and the nursing notes.  Differential diagnosis includes, but is not limited to, metacarpal fracture, contusion  Presents with boxer's fracture.  No laceration, no fight bite.  Initial fracture management provided.  After hematoma block, fracture was manipulated to better align the metacarpal.  Splint placed afterwards.      FINAL CLINICAL IMPRESSION(S) / ED DIAGNOSES   Final diagnoses:  Closed boxer's fracture, initial encounter     Rx / DC Orders   ED Discharge Orders     None        Note:  This document was prepared using Dragon voice recognition software and may include unintentional dictation errors.   Viviann Pastor, MD 08/25/24  2303

## 2024-08-25 NOTE — Discharge Instructions (Addendum)
 Keep hand elevated above your body for the next 48 hours Take tylenol  1000mg  every 8 hours and ibuprofen  600mg  every 6 hours to control pain Follow up with orthopedics in 1 week - call for appointment.  The hand will need to stay immobilized for 6 weeks to allow the injury to heal. In the meantime, keep the splint dry. Do not lift or carry anything with the right hand. Do not apply pressure to the right hand.

## 2024-08-25 NOTE — ED Triage Notes (Signed)
 Pt ambulatory to triage.  Pt has swelling to right hand   pt states he hit a wall with his fist.  Pt alert.
